# Patient Record
Sex: Male | Born: 1945 | Race: White | Marital: Married | Smoking: Never smoker
Health system: Southern US, Community
[De-identification: ages and names within clinical notes are randomized; demographics above are authoritative.]

## PROBLEM LIST (undated history)

## (undated) DIAGNOSIS — Z803 Family history of malignant neoplasm of breast: Secondary | ICD-10-CM

## (undated) DIAGNOSIS — H548 Legal blindness, as defined in USA: Secondary | ICD-10-CM

## (undated) DIAGNOSIS — Z800 Family history of malignant neoplasm of digestive organs: Secondary | ICD-10-CM

## (undated) HISTORY — DX: Family history of malignant neoplasm of breast: Z80.3

## (undated) HISTORY — DX: Family history of malignant neoplasm of digestive organs: Z80.0

## (undated) HISTORY — DX: Legal blindness, as defined in USA: H54.8

---

## 2019-10-08 ENCOUNTER — Ambulatory Visit: Payer: Self-pay

## 2019-10-27 ENCOUNTER — Ambulatory Visit: Payer: Self-pay

## 2021-02-18 ENCOUNTER — Other Ambulatory Visit: Payer: Self-pay | Admitting: Internal Medicine

## 2021-02-18 DIAGNOSIS — E7800 Pure hypercholesterolemia, unspecified: Secondary | ICD-10-CM

## 2021-03-12 ENCOUNTER — Ambulatory Visit
Admission: RE | Admit: 2021-03-12 | Discharge: 2021-03-12 | Disposition: A | Discharge status: Home / Self Care | Payer: No Typology Code available for payment source | Source: Ambulatory Visit | Attending: Internal Medicine | Admitting: Internal Medicine

## 2021-03-12 DIAGNOSIS — E7800 Pure hypercholesterolemia, unspecified: Secondary | ICD-10-CM

## 2021-06-25 DIAGNOSIS — E7800 Pure hypercholesterolemia, unspecified: Secondary | ICD-10-CM | POA: Diagnosis not present

## 2021-07-04 ENCOUNTER — Ambulatory Visit: Payer: No Typology Code available for payment source | Admitting: Cardiovascular Disease

## 2021-07-10 ENCOUNTER — Ambulatory Visit (INDEPENDENT_AMBULATORY_CARE_PROVIDER_SITE_OTHER): Payer: Medicare PPO | Admitting: Cardiovascular Disease

## 2021-07-10 ENCOUNTER — Encounter: Payer: Self-pay | Admitting: Cardiovascular Disease

## 2021-07-10 ENCOUNTER — Other Ambulatory Visit: Payer: Self-pay

## 2021-07-10 VITALS — BP 140/82 | HR 80 | Ht 73.0 in | Wt 246.4 lb

## 2021-07-10 DIAGNOSIS — R931 Abnormal findings on diagnostic imaging of heart and coronary circulation: Secondary | ICD-10-CM | POA: Diagnosis not present

## 2021-07-10 DIAGNOSIS — E7800 Pure hypercholesterolemia, unspecified: Secondary | ICD-10-CM | POA: Diagnosis not present

## 2021-07-10 DIAGNOSIS — I459 Conduction disorder, unspecified: Secondary | ICD-10-CM | POA: Diagnosis not present

## 2021-07-10 DIAGNOSIS — E669 Obesity, unspecified: Secondary | ICD-10-CM | POA: Diagnosis not present

## 2021-07-10 NOTE — Addendum Note (Signed)
Addended by: Thurmon Fair on: 07/10/2021 05:20 PM   Modules accepted: Level of Service

## 2021-07-10 NOTE — Patient Instructions (Signed)
Medication Instructions:  ?No changes ?*If you need a refill on your cardiac medications before your next appointment, please call your pharmacy* ? ? ?Lab Work: ?None ordered ?If you have labs (blood work) drawn today and your tests are completely normal, you will receive your results only by: ?MyChart Message (if you have MyChart) OR ?A paper copy in the mail ?If you have any lab test that is abnormal or we need to change your treatment, we will call you to review the results. ? ? ?Testing/Procedures: ?Your physician has requested that you have an exercise tolerance test. For further information please visit [email or web address]. Please also follow instruction sheet, as given. This will take place at [address], [address]. ?Do not drink or eat foods with caffeine for 24 hours before the test. (Chocolate, coffee, tea, or energy drinks) ?If you use an inhaler, bring it with you to the test. ?Do not smoke for 4 hours before the test. ?Wear comfortable shoes and clothing. ? ? ? ?Follow-Up: ?At [facility], you and your health needs are our priority.  As part of our continuing mission to provide you with exceptional heart care, we have created designated Provider Care Teams.  These Care Teams include your primary Cardiologist (physician) and Advanced Practice Providers (APPs -  Physician Assistants and Nurse Practitioners) who all work together to provide you with the care you need, when you need it. ? ?We recommend signing up for the patient portal called "MyChart".  Sign up information is provided on this After Visit Summary.  MyChart is used to connect with patients for Virtual Visits (Telemedicine).  Patients are able to view lab/test results, encounter notes, upcoming appointments, etc.  Non-urgent messages can be sent to your provider as well.   ?To learn more about what you can do with MyChart, go to [email or web address].   ? ?Your next appointment:   ?12 month(s) ? ?The format for your next  appointment:   ?In Person ? ?Provider:   ?Mihai Croitoru, MD ? ? ?

## 2021-07-10 NOTE — Progress Notes (Addendum)
Cardiology Office Note:    Date:  07/10/2021   ID:  Carlos Suarez, DOB [birth year], MRN [id number]  PCP:  Carlos Brunette, MD   [facility] Providers Cardiologist:  Carlos Fair, MD     Referring MD: Carlos Brunette, MD   Chief Complaint  Patient presents with   Elevated coronary calcium score  Carlos Suarez is a 75 y.o. male who is being seen today for the evaluation of abnormal coronary calcium score at the request of Carlos Brunette, MD.    History of Present Illness:    Carlos Suarez is a 75 y.o. male with a hx of hypercholesterolemia, otherwise excellent health, referred for an elevated coronary calcium score He does not have a known history of CAD or PAD, stroke or myocardial infarction.  He has been taking simvastatin monotherapy for hypercholesterolemia without side effects.  LDL cholesterol was 113 on labs performed in June.  In July, he underwent a coronary calcium score that was elevated at 527 (71st percentile) and also showed aortic atherosclerosis with normal caliber aorta.  He was started on rosuvastatin and ezetimibe.  I do not have his most recent lipid profile, but he tells me he thinks his LDL is now around 70.  The patient specifically denies any chest pain at rest exertion, dyspnea at rest or with exertion, orthopnea, paroxysmal nocturnal dyspnea, syncope, palpitations, focal neurological deficits, intermittent claudication, lower extremity edema, unexplained weight gain, cough, hemoptysis or wheezing.  He walks about 2-1/2 miles 5 to 6 days a week, without complaints of dyspnea or angina.  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  Nevertheless he enjoys target shooting.  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  Past Medical History:  Diagnosis Date   Legally blind in right eye, as defined in [place]     History reviewed. No pertinent surgical history.  Current Medications: Current Meds  Medication Sig   ezetimibe (ZETIA) 10 MG tablet Take 10 mg by mouth  daily.   rosuvastatin (CRESTOR) 20 MG tablet Take by mouth daily.     Allergies:   Fluothane [halothane] and Oxycodone   Social History   Socioeconomic History   Marital status: Married    Spouse name: Not on file   Number of children: Not on file   Years of education: Not on file   Highest education level: Not on file  Occupational History   Not on file  Tobacco Use   Smoking status: Never   Smokeless tobacco: Never  Substance and Sexual Activity   Alcohol use: Not on file   Drug use: Not on file   Sexual activity: Not on file  Other Topics Concern   Not on file  Social History Narrative   Not on file   Social Determinants of Health   Financial Resource Strain: Not on file  Food Insecurity: Not on file  Transportation Needs: Not on file  Physical Activity: Not on file  Stress: Not on file  Social Connections: Not on file     Family History: The patient's family history significantly negative for early onset CAD.  ROS:   Please see the history of present illness.     All other systems reviewed and are negative.  EKGs/Labs/Other Studies Reviewed:    The following studies were reviewed today: Coronary calcium score notes and labs from Dr. Renne Suarez  EKG:  EKG is ordered today.  The ekg ordered today demonstrates normal sinus rhythm (probable left atrial abnormality versus interatrial conduction delay with a  very broad P wave with a major negative deflection), nonspecific intraventricular conduction delay (QRS 118 ms), borderline QTC 454 ms.  Recent Labs: No results found for requested labs within last 8760 hours.  Recent Lipid Panel No results found for: CHOL, TRIG, HDL, CHOLHDL, VLDL, LDLCALC, LDLDIRECT  02/13/2021  Cholesterol 195, HDL 57, LDL 113 prior to most recent change in medications. Hemoglobin 14.1, normal liver function tests, potassium 5.5, creatinine 1.12  Risk Assessment/Calculations:           Physical Exam:    VS:  BP 140/82   Pulse 80    Ht 6\' 1"  (1.854 m)   Wt 246 lb 6.4 oz (111.8 kg)   SpO2 100%   BMI 32.51 kg/m     Wt Readings from Last 3 Encounters:  07/10/21 246 lb 6.4 oz (111.8 kg)     GEN: Appears fit, younger than stated age, well nourished, well developed in no acute distress HEENT: Opacification of right cornea with right eye blindness NECK: No JVD; No carotid bruits LYMPHATICS: No lymphadenopathy CARDIAC: RRR, no murmurs, rubs, gallops RESPIRATORY:  Clear to auscultation without rales, wheezing or rhonchi  ABDOMEN: Soft, non-tender, non-distended MUSCULOSKELETAL:  No edema; No deformity  SKIN: Warm and dry NEUROLOGIC:  Alert and oriented x 3 PSYCHIATRIC:  Normal affect   ASSESSMENT:    1. Elevated coronary artery calcium score   2. Hypercholesterolemia   3. Intraventricular conduction delay   4. Mild obesity    PLAN:    In order of problems listed above:  Abnormal coronary calcium score: Moderately elevated.  When taking this into account his 10-year risk of a major coronary heart event increases to about 20%.  Strongly agree with Dr. 13/10/22 decision to enhance his lipid-lowering therapy.  Target LDL less than 70.  We will schedule him for a plain treadmill stress test. Hyperlipidemia: Tolerating combination of rosuvastatin and ezetimibe.  Would benefit from weight loss. IVCD: Broad QRS at almost 120 ms but not really meeting criteria for either left or right bundle branch block.  No signs or symptoms that he has had high-grade AV block.  Advised to promptly report any episodes of otherwise unexplained dizziness or any syncope or extreme fatigue slow/irregular heartbeats.  No treatment needed.  Continue to monitor.      Shared Decision Making/Informed Consent The risks [chest pain, shortness of breath, cardiac arrhythmias, dizziness, blood pressure fluctuations, myocardial infarction, stroke/transient ischemic attack, and life-threatening complications (estimated to be 1 in 10,000)], benefits  (risk stratification, diagnosing coronary artery disease, treatment guidance) and alternatives of an exercise tolerance test were discussed in detail with Carlos Suarez and he agrees to proceed.    Medication Adjustments/Labs and Tests Ordered: Current medicines are reviewed at length with the patient today.  Concerns regarding medicines are outlined above.  Orders Placed This Encounter  Procedures   Cardiac Stress Test: Informed Consent Details: Physician/Practitioner Attestation; Transcribe to consent form and obtain patient signature   Exercise Tolerance Test   EKG 12-Lead   No orders of the defined types were placed in this encounter.   Patient Instructions  Medication Instructions:  No changes *If you need a refill on your cardiac medications before your next appointment, please call your pharmacy*   Lab Work: None ordered If you have labs (blood work) drawn today and your tests are completely normal, you will receive your results only by: MyChart Message (if you have MyChart) OR A paper copy in the mail If you have any lab  test that is abnormal or we need to change your treatment, we will call you to review the results.   Testing/Procedures: Your physician has requested that you have an exercise tolerance test. For further information please visit [email or web address] Please also follow instruction sheet, as given. This will take place at [address], [address]. Do not drink or eat foods with caffeine for 24 hours before the test. (Chocolate, coffee, tea, or energy drinks) If you use an inhaler, bring it with you to the test. Do not smoke for 4 hours before the test. Wear comfortable shoes and clothing.  Follow-Up: At [facility], you and your health needs are our priority.  As part of our continuing mission to provide you with exceptional heart care, we have created designated Provider Care Teams.  These Care Teams include your primary Cardiologist (physician)  and Advanced Practice Providers (APPs -  Physician Assistants and Nurse Practitioners) who all work together to provide you with the care you need, when you need it.  We recommend signing up for the patient portal called "MyChart".  Sign up information is provided on this After Visit Summary.  MyChart is used to connect with patients for Virtual Visits (Telemedicine).  Patients are able to view lab/test results, encounter notes, upcoming appointments, etc.  Non-urgent messages can be sent to your provider as well.   To learn more about what you can do with MyChart, go to [email or web address].    Your next appointment:   12 month(s)  The format for your next appointment:   In Person  Provider:   Thurmon Fair, MD   Signed, Carlos Fair, MD  07/10/2021 5:20 PM    [facility]

## 2021-07-11 ENCOUNTER — Telehealth (HOSPITAL_COMMUNITY): Payer: Self-pay | Admitting: *Deleted

## 2021-07-11 NOTE — Telephone Encounter (Signed)
Close encounter 

## 2021-07-15 ENCOUNTER — Encounter (HOSPITAL_COMMUNITY): Payer: Self-pay | Admitting: *Deleted

## 2021-07-15 ENCOUNTER — Other Ambulatory Visit: Payer: Self-pay

## 2021-07-15 ENCOUNTER — Ambulatory Visit (HOSPITAL_COMMUNITY)
Admission: RE | Admit: 2021-07-15 | Discharge: 2021-07-15 | Disposition: A | Discharge status: Home / Self Care | Payer: Medicare PPO | Source: Ambulatory Visit | Attending: Cardiovascular Disease | Admitting: Cardiovascular Disease

## 2021-07-15 ENCOUNTER — Encounter (HOSPITAL_COMMUNITY): Payer: Self-pay

## 2021-07-15 DIAGNOSIS — R931 Abnormal findings on diagnostic imaging of heart and coronary circulation: Secondary | ICD-10-CM

## 2021-07-15 NOTE — Progress Notes (Unsigned)
Patient arrived for his ETT but was not able to complete the study due to the ABNORMAL EKG that was taken prior to starting the actual test. Abnormal rhythm strips and EKG were reviewed by Dr. Allyson Sabal. He stated that the study would be read inconclusive due to the ABNORMAL EKG changes.

## 2021-07-17 ENCOUNTER — Other Ambulatory Visit: Payer: Self-pay | Admitting: *Deleted

## 2021-07-17 DIAGNOSIS — E7800 Pure hypercholesterolemia, unspecified: Secondary | ICD-10-CM

## 2021-07-17 DIAGNOSIS — R931 Abnormal findings on diagnostic imaging of heart and coronary circulation: Secondary | ICD-10-CM

## 2021-07-22 ENCOUNTER — Encounter (HOSPITAL_COMMUNITY): Payer: Self-pay | Admitting: Cardiovascular Disease

## 2021-07-30 ENCOUNTER — Telehealth (HOSPITAL_COMMUNITY): Payer: Self-pay | Admitting: *Deleted

## 2021-07-30 NOTE — Telephone Encounter (Signed)
Patient given detailed instructions per Myocardial Perfusion Study Information Sheet for the test on 08/06/21  Patient notified to arrive 15 minutes early and that it is imperative to arrive on time for appointment to keep from having the test rescheduled.  If you need to cancel or reschedule your appointment, please call the office within 24 hours of your appointment. . Patient verbalized understanding. Carlos Suarez   

## 2021-08-06 ENCOUNTER — Ambulatory Visit (HOSPITAL_COMMUNITY): Payer: Medicare PPO | Attending: Cardiovascular Disease

## 2021-08-06 ENCOUNTER — Other Ambulatory Visit: Payer: Self-pay

## 2021-08-06 DIAGNOSIS — E7800 Pure hypercholesterolemia, unspecified: Secondary | ICD-10-CM | POA: Insufficient documentation

## 2021-08-06 DIAGNOSIS — R931 Abnormal findings on diagnostic imaging of heart and coronary circulation: Secondary | ICD-10-CM | POA: Insufficient documentation

## 2021-08-06 DIAGNOSIS — R0609 Other forms of dyspnea: Secondary | ICD-10-CM | POA: Insufficient documentation

## 2021-08-06 DIAGNOSIS — R9431 Abnormal electrocardiogram [ECG] [EKG]: Secondary | ICD-10-CM | POA: Insufficient documentation

## 2021-08-06 DIAGNOSIS — Z136 Encounter for screening for cardiovascular disorders: Secondary | ICD-10-CM | POA: Insufficient documentation

## 2021-08-06 LAB — MYOCARDIAL PERFUSION IMAGING
Base ST Depression (mm): 1 mm
LV dias vol: 179 mL (ref 62–150)
LV sys vol: 107 mL
Nuc Stress EF: 40 %
Peak HR: 97 {beats}/min
Rest HR: 77 {beats}/min
Rest Nuclear Isotope Dose: 10.9 mCi
SDS: 5
SRS: 1
SSS: 6
ST Depression (mm): 0 mm
Stress Nuclear Isotope Dose: 32.5 mCi
TID: 0.85

## 2021-08-06 MED ORDER — TECHNETIUM TC 99M TETROFOSMIN IV KIT
32.5000 | PACK | Freq: Once | INTRAVENOUS | Status: AC | PRN
  Administered 2021-08-06: 32.5 via INTRAVENOUS
  Filled 2021-08-06: qty 33

## 2021-08-06 MED ORDER — TECHNETIUM TC 99M TETROFOSMIN IV KIT
10.9000 | PACK | Freq: Once | INTRAVENOUS | Status: AC | PRN
  Administered 2021-08-06: 10.9 via INTRAVENOUS
  Filled 2021-08-06: qty 11

## 2021-08-06 MED ORDER — REGADENOSON 0.4 MG/5ML IV SOLN
0.4000 mg | Freq: Once | INTRAVENOUS | Status: AC
  Administered 2021-08-06: 0.4 mg via INTRAVENOUS

## 2021-08-07 ENCOUNTER — Other Ambulatory Visit: Payer: Self-pay | Admitting: *Deleted

## 2021-08-07 DIAGNOSIS — R931 Abnormal findings on diagnostic imaging of heart and coronary circulation: Secondary | ICD-10-CM

## 2021-08-22 ENCOUNTER — Ambulatory Visit (HOSPITAL_COMMUNITY): Payer: Medicare PPO | Attending: Cardiology

## 2021-08-22 ENCOUNTER — Other Ambulatory Visit: Payer: Self-pay

## 2021-08-22 DIAGNOSIS — R931 Abnormal findings on diagnostic imaging of heart and coronary circulation: Secondary | ICD-10-CM | POA: Insufficient documentation

## 2021-08-22 DIAGNOSIS — E7800 Pure hypercholesterolemia, unspecified: Secondary | ICD-10-CM | POA: Diagnosis not present

## 2021-08-22 DIAGNOSIS — I351 Nonrheumatic aortic (valve) insufficiency: Secondary | ICD-10-CM | POA: Diagnosis not present

## 2021-08-23 LAB — ECHOCARDIOGRAM COMPLETE
AR max vel: 3.36 cm2
AV Area VTI: 3.32 cm2
AV Area mean vel: 3.06 cm2
AV Mean grad: 4 mmHg
AV Peak grad: 5.8 mmHg
Ao pk vel: 1.21 m/s
Area-P 1/2: 2.74 cm2
P 1/2 time: 465 msec
S' Lateral: 4.5 cm

## 2021-08-25 DIAGNOSIS — E785 Hyperlipidemia, unspecified: Secondary | ICD-10-CM | POA: Diagnosis not present

## 2021-08-25 DIAGNOSIS — Z7982 Long term (current) use of aspirin: Secondary | ICD-10-CM | POA: Diagnosis not present

## 2021-08-25 DIAGNOSIS — Z6833 Body mass index (BMI) 33.0-33.9, adult: Secondary | ICD-10-CM | POA: Diagnosis not present

## 2021-08-25 DIAGNOSIS — E669 Obesity, unspecified: Secondary | ICD-10-CM | POA: Diagnosis not present

## 2021-08-25 DIAGNOSIS — R030 Elevated blood-pressure reading, without diagnosis of hypertension: Secondary | ICD-10-CM | POA: Diagnosis not present

## 2021-08-25 DIAGNOSIS — I951 Orthostatic hypotension: Secondary | ICD-10-CM | POA: Diagnosis not present

## 2021-08-26 ENCOUNTER — Telehealth: Payer: Self-pay | Admitting: *Deleted

## 2021-08-26 ENCOUNTER — Encounter: Payer: Self-pay | Admitting: *Deleted

## 2021-08-26 DIAGNOSIS — R931 Abnormal findings on diagnostic imaging of heart and coronary circulation: Secondary | ICD-10-CM

## 2021-08-26 DIAGNOSIS — Z01818 Encounter for other preprocedural examination: Secondary | ICD-10-CM

## 2021-08-26 NOTE — Telephone Encounter (Signed)
-----   Message from Thurmon Fair, MD sent at 08/25/2021  9:28 AM EST ----- Echocardiogram does show reduction in heart pumping function with ejection fraction of 45-50% (normal 55-60%).  The nuclear stress test had estimated the ejection fraction to be 40%. The interpreting cardiologist thought that there were some regional abnormalities (this would support a diagnosis of coronary blockages), but honestly, on my review I think it is a global reduction in heart pumping function. Nevertheless, the logical next step is heart catheterization and coronary angiography, the gold standard for evaluating coronary blockages.  This is not an emergency since he is not having any symptoms. Will try to call him directly later today

## 2021-08-26 NOTE — Telephone Encounter (Signed)
Left a message for the patient to call back.  

## 2021-08-26 NOTE — Telephone Encounter (Signed)
Patient made aware of results and verbalized understanding. He stated that he is currently asymptomatic. He has been advised to call if anything changes.   Cardiac cath scheduled for 09/10/21 with Dr. Kirke Corin. Instructions have been sent via MyChart as well The patient would like to talk to Dr. Royann Shivers when he returns to discuss the cath before he proceeds with it.    You are scheduled for a Cardiac Catheterization on Wednesday, January 11 with Dr. Lorine Bears.  1. Please arrive at the [facility] (Main Entrance A) at [facility]: [address] [place], [place] [address] at 12:00 PM (This time is two hours before your procedure to ensure your preparation). Free valet parking service is available.   Special note: Every effort is made to have your procedure done on time. Please understand that emergencies sometimes delay scheduled procedures.  2. Diet: Do not eat solid foods after midnight.  The patient may have clear liquids until 5am upon the day of the procedure.  3. Labs:  Your provider would like for you to return  by 09/03/21 to have the following labs drawn: BMET and CBC. You do not need an appointment for the lab. Once in our office lobby there is a podium where you can sign in and ring the doorbell to alert [place] that you are here. The lab is open from 8:00 am to 4:30 pm; closed for lunch from 12:45pm-1:45pm.   4. Medication instructions in preparation for your procedure: Nothing to hold  On the morning of your procedure, take your Aspirin and any morning medicines NOT listed above.  You may use sips of water.  5. Plan for one night stay--bring personal belongings. 6. Bring a current list of your medications and current insurance cards. 7. You MUST have a responsible person to drive you home. 8. Someone MUST be with you the first 24 hours after you arrive home or your discharge will be delayed. 9. Please wear clothes that are easy to get on and off and wear slip-on  shoes.  Thank you for allowing [place] to care for you!   -- [facility]

## 2021-09-02 DIAGNOSIS — R931 Abnormal findings on diagnostic imaging of heart and coronary circulation: Secondary | ICD-10-CM | POA: Diagnosis not present

## 2021-09-02 DIAGNOSIS — Z01818 Encounter for other preprocedural examination: Secondary | ICD-10-CM | POA: Diagnosis not present

## 2021-09-03 ENCOUNTER — Telehealth: Payer: Self-pay | Admitting: Cardiovascular Disease

## 2021-09-03 LAB — BASIC METABOLIC PANEL
BUN/Creatinine Ratio: 15 (ref 10–24)
BUN: 17 mg/dL (ref 8–27)
CO2: 20 mmol/L (ref 20–29)
Calcium: 9.6 mg/dL (ref 8.6–10.2)
Chloride: 103 mmol/L (ref 96–106)
Creatinine, Ser: 1.13 mg/dL (ref 0.76–1.27)
Glucose: 81 mg/dL (ref 70–99)
Potassium: 5.1 mmol/L (ref 3.5–5.2)
Sodium: 142 mmol/L (ref 134–144)
eGFR: 69 mL/min/{1.73_m2} (ref >59)

## 2021-09-03 LAB — CBC
Hematocrit: 42.7 % (ref 37.5–51.0)
Hemoglobin: 13.8 g/dL (ref 13.0–17.7)
MCH: 30 pg (ref 26.6–33.0)
MCHC: 32.3 g/dL (ref 31.5–35.7)
MCV: 93 fL (ref 79–97)
Platelets: 161 10*3/uL (ref 150–450)
RBC: 4.6 x10E6/uL (ref 4.14–5.80)
RDW: 12.2 % (ref 11.6–15.4)
WBC: 8.2 10*3/uL (ref 3.4–10.8)

## 2021-09-03 NOTE — Telephone Encounter (Addendum)
Spoke with patient. He stated he got blood work yesterday, and later in the day, when he removed his bandage, "blood trickled down my arm and onto the floor." The bleeding stopped after 3-5 minutes of applied pressure. He also asked about lab results. I told him they were normal. The patient wants to know if he should still be taking the ASA 81 mg because he is having a cardiac cath next week. Please advise.

## 2021-09-03 NOTE — Telephone Encounter (Signed)
Patient has an appointment on 1/9 with Dr. Royann Shivers. This will be discussed then. Patient is aware.

## 2021-09-03 NOTE — Telephone Encounter (Signed)
Pt c/o medication issue:  1. Name of Medication: aspirin EC 81 MG tablet  2. How are you currently taking this medication (dosage and times per day)? 1 tablet daily  3. Are you having a reaction (difficulty breathing--STAT)? no  4. What is your medication issue? Patient states he had lab work done yesterday, but when he removed the bandage there was a squirt of blood. He says he has a cath scheduled in about a week and would like to make sure it is still okay for him to hold the baby aspirin.

## 2021-09-08 ENCOUNTER — Ambulatory Visit: Payer: Medicare PPO | Admitting: Cardiovascular Disease

## 2021-09-08 ENCOUNTER — Encounter: Payer: Self-pay | Admitting: Cardiovascular Disease

## 2021-09-08 ENCOUNTER — Other Ambulatory Visit: Payer: Self-pay

## 2021-09-08 VITALS — BP 134/76 | HR 83 | Ht 73.0 in | Wt 245.0 lb

## 2021-09-08 DIAGNOSIS — R931 Abnormal findings on diagnostic imaging of heart and coronary circulation: Secondary | ICD-10-CM | POA: Diagnosis not present

## 2021-09-08 NOTE — Progress Notes (Signed)
°Cardiology Office Note:   ° °Date:  09/08/2021  ° °ID:  Carlos Suarez, DOB 04/19/1946, MRN [id number] ° °PCP:  Pharr, Walter, MD °  °[facility] Providers °Cardiologist:  Itzae Miralles, MD    ° °Referring MD: Pharr, Walter, MD  ° °Chief Complaint  °Patient presents with  ° Cardiomyopathy  ° ° °History of Present Illness:   ° °Thedore Suarez is a 76 y.o. 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 ° °He has never had angina pectoris, either at rest or with activity.  Throughout most of the year he has a daily routine where he walks 2 miles.  He usually outpaces his wife and does extra laps.  He denies exertional dyspnea.  Over the summer he did notice increased fatigue, but blamed this on the heat and on "getting old".  His baseline ECG shows sinus rhythm with left anterior fascicular block, without any Q waves or repolarization abnormalities.  He takes a combination of rosuvastatin and ezetimibe for his hypercholesterolemia.  He denies palpitations, syncope, orthopnea, PND, lower extremity edema, claudication, focal neurological complaints other than right eye blindness. ° °He was initially scheduled for a plain ECG treadmill stress test, but when he presented he had baseline ECG changes and it was felt by the DOD that this would interfere with his ECG tracing evaluation.  He was therefore switched to a pharmacological nuclear scan.  This showed a normal pattern of myocardial perfusion, but surprisingly showed a decreased LVEF of only 40%.  An echocardiogram was performed to confirm this and showed an LVEF of 45-50%.  The ventricle was also dilated with an end-diastolic diameter of 6.1 cm.  The left atrium was also markedly dilated at 4.9 cm.  The echo was felt to show hypokinesis of the mid-apical  inferoseptum and inferior wall, although on my evaluation, I think that the left ventricular dysfunction is global.  It was noted that he had frequent ventricular ectopy throughout the study. ° °Past Medical History:  °Diagnosis Date  ° Legally blind in right eye, as defined in [place]   ° ° °History reviewed. No pertinent surgical history. ° °Current Medications: °Current Meds  °Medication Sig  ° aspirin EC 81 MG tablet Take 81 mg by mouth daily. Swallow whole.  ° ezetimibe (ZETIA) 10 MG tablet Take 10 mg by mouth daily.  ° rosuvastatin (CRESTOR) 20 MG tablet Take by mouth daily.  °  ° °Allergies:   Fluothane [halothane] and Oxycodone  ° °Social History  ° °Socioeconomic History  ° Marital status: Married  °  Spouse name: Not on file  ° Number of children: Not on file  ° Years of education: Not on file  ° Highest education level: Not on file  °Occupational History  ° Not on file  °Tobacco Use  ° Smoking status: Never  ° Smokeless tobacco: Never  °Substance and Sexual Activity  ° Alcohol use: Not on file  ° Drug use: Not on file  ° Sexual activity: Not on file  °Other Topics Concern  ° Not on file  °Social History Narrative  ° Not on file  ° °Social Determinants of Health  ° °Financial Resource Strain: Not on file  °Food Insecurity: Not on file  °Transportation Needs: Not on file  °Physical Activity: Not on file  °Stress: Not on file  °Social   Connections: Not on file  °  ° °Family History: °The patient's only history is significant for the absence of early onset CAD or CHF, known cardiomyopathy or unexplained premature death ° °ROS:   °Please see the history of present illness.    ° All other systems reviewed and are negative. ° °EKGs/Labs/Other Studies Reviewed:   ° °The following studies were reviewed today: °08/06/2021 pharmacological nuclear perfusion study °  Findings are consistent with no prior ischemia and no prior myocardial infarction. The study is intermediate risk. °  No ST deviation was noted. °  LV  perfusion is normal. °  Left ventricular function is abnormal. Global function is moderately reduced. Nuclear stress EF: 40 %. The left ventricular ejection fraction is moderately decreased (30-44%). End diastolic cavity size is mildly enlarged. °  Prior study not available for comparison. °  °Intermediate risk study with normal myocardial perfusion, but with a dilated left ventricle and moderately reduced global systolic function. °Findings suggest nonischemic dilated cardiomyopathy, but gated images could be causing artifactual underestimation of left ventricular function due to frequent PVCs. °Recommend correlation with echocardiogram. ° °Echocardiogram 08/22/2021 ° ° 1. Left ventricular ejection fraction, by estimation, is 45 to 50%. The  °left ventricle has mildly decreased function. The left ventricle  °demonstrates regional wall motion abnormalities (see scoring  °diagram/findings for description). The left ventricular  ° internal cavity size was moderately dilated. Left ventricular diastolic  °parameters are indeterminate. There is hypokinesis of the left  °ventricular, mid-apical inferoseptal wall and inferior wall.  ° 2. Right ventricular systolic function is normal. The right ventricular  °size is normal. There is normal pulmonary artery systolic pressure.  ° 3. The mitral valve is normal in structure. Trivial mitral valve  °regurgitation. No evidence of mitral stenosis.  ° 4. The aortic valve is tricuspid. Aortic valve regurgitation is mild. No  °aortic stenosis is present.  ° 5. The inferior vena cava is normal in size with greater than 50%  °respiratory variability, suggesting right atrial pressure of 3 mmHg.  ° °Comparison(s): No prior Echocardiogram.  ° °Conclusion(s)/Recommendation(s): Frequent ectopy throughout study, reduced  °sensitivity for focal wall motion abnormalities, especially in anterior  °and anterolateral walls.  ° °EKG:  EKG is ordered today.  The ekg ordered today demonstrates normal  sinus rhythm, left anterior fascicular block, no change ° °Recent Labs: °09/02/2021: BUN 17; Creatinine, Ser 1.13; Hemoglobin 13.8; Platelets 161; Potassium 5.1; Sodium 142  °Recent Lipid Panel °No results found for: CHOL, TRIG, HDL, CHOLHDL, VLDL, LDLCALC, LDLDIRECT °02/13/2021 °02/13/2021 °  °Cholesterol 195, HDL 57, LDL 113 prior to most recent change in medications. °Hemoglobin 14.1, normal liver function tests, potassium 5.5, creatinine 1.12 ° °Risk Assessment/Calculations:   °  ° °    ° °Physical Exam:   ° °VS:  BP 134/76 (BP Location: Left Arm, Patient Position: Sitting, Cuff Size: Large)    Pulse 83    Ht 6' 1" (1.854 m)    Wt 245 lb (111.1 kg)    SpO2 96%    BMI 32.32 kg/m²    ° °Wt Readings from Last 3 Encounters:  °09/08/21 245 lb (111.1 kg)  °08/06/21 246 lb (111.6 kg)  °07/10/21 246 lb 6.4 oz (111.8 kg)  °  ° °GEN: Obese, well nourished, well developed in no acute distress °HEENT: Normal °NECK: No JVD; No carotid bruits °LYMPHATICS: No lymphadenopathy °CARDIAC: Occasional ectopy on a background of RRR, no murmurs, rubs, gallops °RESPIRATORY:  Clear to auscultation without rales, wheezing or   rhonchi  °ABDOMEN: Soft, non-tender, non-distended °MUSCULOSKELETAL:  No edema; No deformity  °SKIN: Warm and dry °NEUROLOGIC:  Alert and oriented x 3 °PSYCHIATRIC:  Normal affect  ° °ASSESSMENT:   ° °1. Elevated coronary artery calcium score   ° °PLAN:   ° °In order of problems listed above: ° °Cardiomyopathy: Both the echocardiogram and the nuclear perfusion study show a mildly depressed LVEF.  He does not have any symptoms of congestive heart failure (NYHA functional class I) and he is euvolemic clinically.  In view of his age, gender and elevated coronary calcium score, the first suspicion is that he may have more extensive CAD than we realize (potentially dominant left main coronary artery stenosis).  Recommend cardiac catheterization, coronary angiography, revascularization if appropriate. This procedure has been  fully reviewed with the patient and written informed consent has been obtained.  Having said that, he has never had anything that could be construed to be angina pectoris, does not have any ischemic changes on ECG, and the coronary calcium score, although abnormal, is not that impressive.  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 Would like a R and L heart cath to confirm what appear to be well compensated hemodynamics. °Elevated coronary calcium score: Target LDL cholesterol less than 70.  Ezetimibe was added to his statin a few months ago, repeat lipid panel planned in Dr. Pharr's office. 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  Unlikely to benefit from CRT. °   ° °Shared Decision Making/Informed Consent °The risks [stroke (1 in 1000), death (1 in 1000), kidney failure [usually temporary] (1 in 500), bleeding (1 in 200), allergic reaction [possibly serious] (1 in 200)], benefits (diagnostic support and management of coronary artery disease) and alternatives of a cardiac catheterization were discussed in detail with Mr. Allcock and he is willing to proceed.  ° ° °Medication Adjustments/Labs and Tests Ordered: °Current medicines are reviewed at length with the patient today.  Concerns regarding medicines are outlined above.  °Orders Placed This Encounter  °Procedures  ° EKG 12-Lead  ° °No orders of the defined types were placed in this encounter. ° ° °Patient Instructions  °Medication Instructions:  °No changes °*If you need a refill on your cardiac medications before your next appointment, please call your pharmacy* ° ° °Lab Work: °None ordered °If you have labs (blood work) drawn today and your tests are completely normal, you will receive your results only by: °MyChart Message (if you have MyChart) OR °A paper copy in the mail °If you have any lab test that is abnormal or we need to change your treatment, we will call you to  review the results. ° ° °Testing/Procedures: °None ordered ° ° °Follow-Up: °At [facility], you and your health needs are our priority.  As part of our continuing mission to provide you with exceptional heart care, we have created designated Provider Care Teams.  These Care Teams include your primary Cardiologist (physician) and Advanced Practice Providers (APPs -  Physician Assistants and Nurse Practitioners) who all work together to provide you with the care you need, when you need it. ° °We recommend signing up for the patient portal called "MyChart".  Sign up information is provided on this After Visit Summary.  MyChart is used to connect with patients for Virtual Visits (Telemedicine).  Patients are able to view lab/test results, encounter notes, upcoming appointments, etc.  Non-urgent messages can be sent to your provider as well.   °  To learn more about what you can do with MyChart, go to [email or web address].   ° °Your next appointment:   °2 week(s)  ° °The format for your next appointment:   °In Person ° °Provider:   °Kaydan Wilhoite, MD  °If MD is not listed, click here to update    :1}  ° ° °  ° °Signed, °Dhiya Smits, MD  °09/08/2021 2:16 PM    °[facility] ° °

## 2021-09-08 NOTE — H&P (View-Only) (Signed)
Cardiology Office Note:    Date:  09/08/2021   ID:  Carlos Suarez, DOB 11-Dec-1945, MRN [id number]  PCP:  Deland Pretty, MD   [facility] Providers Cardiologist:  Sanda Klein, MD     Referring MD: Deland Pretty, MD   Chief Complaint  Patient presents with   Cardiomyopathy    History of Present Illness:    Carlos Suarez is a 76 y.o. 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  He has never had angina pectoris, either at rest or with activity.  Throughout most of the year he has a daily routine where he walks 2 miles.  He usually outpaces his wife and does extra laps.  He denies exertional dyspnea.  Over the summer he did notice increased fatigue, but blamed this on the heat and on "getting old".  His baseline ECG shows sinus rhythm with left anterior fascicular block, without any Q waves or repolarization abnormalities.  He takes a combination of rosuvastatin and ezetimibe for his hypercholesterolemia.  He denies palpitations, syncope, orthopnea, PND, lower extremity edema, claudication, focal neurological complaints other than right eye blindness.  He was initially scheduled for a plain ECG treadmill stress test, but when he presented he had baseline ECG changes and it was felt by the DOD that this would interfere with his ECG tracing evaluation.  He was therefore switched to a pharmacological nuclear scan.  This showed a normal pattern of myocardial perfusion, but surprisingly showed a decreased LVEF of only 40%.  An echocardiogram was performed to confirm this and showed an LVEF of 45-50%.  The ventricle was also dilated with an end-diastolic diameter of 6.1 cm.  The left atrium was also markedly dilated at 4.9 cm.  The echo was felt to show hypokinesis of the mid-apical  inferoseptum and inferior wall, although on my evaluation, I think that the left ventricular dysfunction is global.  It was noted that he had frequent ventricular ectopy throughout the study.  Past Medical History:  Diagnosis Date   Legally blind in right eye, as defined in [place]     History reviewed. No pertinent surgical history.  Current Medications: Current Meds  Medication Sig   aspirin EC 81 MG tablet Take 81 mg by mouth daily. Swallow whole.   ezetimibe (ZETIA) 10 MG tablet Take 10 mg by mouth daily.   rosuvastatin (CRESTOR) 20 MG tablet Take by mouth daily.     Allergies:   Fluothane [halothane] and Oxycodone   Social History   Socioeconomic History   Marital status: Married    Spouse name: Not on file   Number of children: Not on file   Years of education: Not on file   Highest education level: Not on file  Occupational History   Not on file  Tobacco Use   Smoking status: Never   Smokeless tobacco: Never  Substance and Sexual Activity   Alcohol use: Not on file   Drug use: Not on file   Sexual activity: Not on file  Other Topics Concern   Not on file  Social History Narrative   Not on file   Social Determinants of Health   Financial Resource Strain: Not on file  Food Insecurity: Not on file  Transportation Needs: Not on file  Physical Activity: Not on file  Stress: Not on file  Social  Connections: Not on file     Family History: The patient's only history is significant for the absence of early onset CAD or CHF, known cardiomyopathy or unexplained premature death  ROS:   Please see the history of present illness.     All other systems reviewed and are negative.  EKGs/Labs/Other Studies Reviewed:    The following studies were reviewed today: 08/06/2021 pharmacological nuclear perfusion study   Findings are consistent with no prior ischemia and no prior myocardial infarction. The study is intermediate risk.   No ST deviation was noted.   LV  perfusion is normal.   Left ventricular function is abnormal. Global function is moderately reduced. Nuclear stress EF: 40 %. The left ventricular ejection fraction is moderately decreased (30-44%). End diastolic cavity size is mildly enlarged.   Prior study not available for comparison.   Intermediate risk study with normal myocardial perfusion, but with a dilated left ventricle and moderately reduced global systolic function. Findings suggest nonischemic dilated cardiomyopathy, but gated images could be causing artifactual underestimation of left ventricular function due to frequent PVCs. Recommend correlation with echocardiogram.  Echocardiogram 08/22/2021   1. Left ventricular ejection fraction, by estimation, is 45 to 50%. The  left ventricle has mildly decreased function. The left ventricle  demonstrates regional wall motion abnormalities (see scoring  diagram/findings for description). The left ventricular   internal cavity size was moderately dilated. Left ventricular diastolic  parameters are indeterminate. There is hypokinesis of the left  ventricular, mid-apical inferoseptal wall and inferior wall.   2. Right ventricular systolic function is normal. The right ventricular  size is normal. There is normal pulmonary artery systolic pressure.   3. The mitral valve is normal in structure. Trivial mitral valve  regurgitation. No evidence of mitral stenosis.   4. The aortic valve is tricuspid. Aortic valve regurgitation is mild. No  aortic stenosis is present.   5. The inferior vena cava is normal in size with greater than 50%  respiratory variability, suggesting right atrial pressure of 3 mmHg.   Comparison(s): No prior Echocardiogram.   Conclusion(s)/Recommendation(s): Frequent ectopy throughout study, reduced  sensitivity for focal wall motion abnormalities, especially in anterior  and anterolateral walls.   EKG:  EKG is ordered today.  The ekg ordered today demonstrates normal  sinus rhythm, left anterior fascicular block, no change  Recent Labs: 09/02/2021: BUN 17; Creatinine, Ser 1.13; Hemoglobin 13.8; Platelets 161; Potassium 5.1; Sodium 142  Recent Lipid Panel No results found for: CHOL, TRIG, HDL, CHOLHDL, VLDL, LDLCALC, LDLDIRECT 02/13/2021 02/13/2021   Cholesterol 195, HDL 57, LDL 113 prior to most recent change in medications. Hemoglobin 14.1, normal liver function tests, potassium 5.5, creatinine 1.12  Risk Assessment/Calculations:           Physical Exam:    VS:  BP 134/76 (BP Location: Left Arm, Patient Position: Sitting, Cuff Size: Large)    Pulse 83    Ht 6\' 1"  (1.854 m)    Wt 245 lb (111.1 kg)    SpO2 96%    BMI 32.32 kg/m     Wt Readings from Last 3 Encounters:  09/08/21 245 lb (111.1 kg)  08/06/21 246 lb (111.6 kg)  07/10/21 246 lb 6.4 oz (111.8 kg)     GEN: Obese, well nourished, well developed in no acute distress HEENT: Normal NECK: No JVD; No carotid bruits LYMPHATICS: No lymphadenopathy CARDIAC: Occasional ectopy on a background of RRR, no murmurs, rubs, gallops RESPIRATORY:  Clear to auscultation without rales, wheezing or  rhonchi  ABDOMEN: Soft, non-tender, non-distended MUSCULOSKELETAL:  No edema; No deformity  SKIN: Warm and dry NEUROLOGIC:  Alert and oriented x 3 PSYCHIATRIC:  Normal affect   ASSESSMENT:    1. Elevated coronary artery calcium score    PLAN:    In order of problems listed above:  Cardiomyopathy: Both the echocardiogram and the nuclear perfusion study show a mildly depressed LVEF.  He does not have any symptoms of congestive heart failure (NYHA functional class I) and he is euvolemic clinically.  In view of his age, gender and elevated coronary calcium score, the first suspicion is that he may have more extensive CAD than we realize (potentially dominant left main coronary artery stenosis).  Recommend cardiac catheterization, coronary angiography, revascularization if appropriate. This procedure has been  fully reviewed with the patient and written informed consent has been obtained.  Having said that, he has never had anything that could be construed to be angina pectoris, does not have any ischemic changes on ECG, and the coronary calcium score, although abnormal, is not that impressive.  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 Would like a R and L heart cath to confirm what appear to be well compensated hemodynamics. Elevated coronary calcium score: Target LDL cholesterol less than 70.  Ezetimibe was added to his statin a few months ago, repeat lipid panel planned in Dr. Pennie Banter office. 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  Unlikely to benefit from CRT.     Shared Decision Making/Informed Consent The risks [stroke (1 in 1000), death (1 in 1000), kidney failure [usually temporary] (1 in 500), bleeding (1 in 200), allergic reaction [possibly serious] (1 in 200)], benefits (diagnostic support and management of coronary artery disease) and alternatives of a cardiac catheterization were discussed in detail with Mr. Garvie and he is willing to proceed.    Medication Adjustments/Labs and Tests Ordered: Current medicines are reviewed at length with the patient today.  Concerns regarding medicines are outlined above.  Orders Placed This Encounter  Procedures   EKG 12-Lead   No orders of the defined types were placed in this encounter.   Patient Instructions  Medication Instructions:  No changes *If you need a refill on your cardiac medications before your next appointment, please call your pharmacy*   Lab Work: None ordered If you have labs (blood work) drawn today and your tests are completely normal, you will receive your results only by: [place] (if you have MyChart) OR A paper copy in the mail If you have any lab test that is abnormal or we need to change your treatment, we will call you to  review the results.   Testing/Procedures: None ordered   Follow-Up: At [facility], you and your health needs are our priority.  As part of our continuing mission to provide you with exceptional heart care, we have created designated Provider Care Teams.  These Care Teams include your primary Cardiologist (physician) and Advanced Practice Providers (APPs -  Physician Assistants and Nurse Practitioners) who all work together to provide you with the care you need, when you need it.  We recommend signing up for the patient portal called "MyChart".  Sign up information is provided on this After Visit Summary.  MyChart is used to connect with patients for Virtual Visits (Telemedicine).  Patients are able to view lab/test results, encounter notes, upcoming appointments, etc.  Non-urgent messages can be sent to your provider as well.  To learn more about what you can do with MyChart, go to [email or web address].    Your next appointment:   2 week(s)   The format for your next appointment:   In Person  Provider:   Sanda Klein, MD  If MD is not listed, click here to update    :1}       Signed, Sanda Klein, MD  09/08/2021 2:16 PM    [place]

## 2021-09-08 NOTE — Patient Instructions (Signed)
Medication Instructions:  No changes *If you need a refill on your cardiac medications before your next appointment, please call your pharmacy*   Lab Work: None ordered If you have labs (blood work) drawn today and your tests are completely normal, you will receive your results only by: MyChart Message (if you have MyChart) OR A paper copy in the mail If you have any lab test that is abnormal or we need to change your treatment, we will call you to review the results.   Testing/Procedures: None ordered   Follow-Up: At [facility], you and your health needs are our priority.  As part of our continuing mission to provide you with exceptional heart care, we have created designated Provider Care Teams.  These Care Teams include your primary Cardiologist (physician) and Advanced Practice Providers (APPs -  Physician Assistants and Nurse Practitioners) who all work together to provide you with the care you need, when you need it.  We recommend signing up for the patient portal called "MyChart".  Sign up information is provided on this After Visit Summary.  MyChart is used to connect with patients for Virtual Visits (Telemedicine).  Patients are able to view lab/test results, encounter notes, upcoming appointments, etc.  Non-urgent messages can be sent to your provider as well.   To learn more about what you can do with MyChart, go to [email or web address].    Your next appointment:   2 week(s)   The format for your next appointment:   In Person  Provider:   Thurmon Fair, MD  If MD is not listed, click here to update    :1}

## 2021-09-09 ENCOUNTER — Telehealth: Payer: Self-pay | Admitting: Cardiovascular Disease

## 2021-09-09 ENCOUNTER — Telehealth: Payer: Self-pay | Admitting: *Deleted

## 2021-09-09 NOTE — Telephone Encounter (Signed)
A user error has taken place: encounter opened in error, closed for administrative reasons.

## 2021-09-09 NOTE — Telephone Encounter (Signed)
Pt still doesn't have insurance authorization on file for procedure tomorrow... please advise

## 2021-09-09 NOTE — Telephone Encounter (Signed)
Spoke with the precert department. Message for precert had already been sent previously. Cath authorization is pending.

## 2021-09-09 NOTE — Telephone Encounter (Signed)
Message sent to Pre Cert pool to pre cert cath scheduled tomorrow 09/10/21.

## 2021-09-09 NOTE — Telephone Encounter (Signed)
Cardiac catheterization scheduled at [facility] for: Wednesday September 10, 2021 2 PM [facility] Main Entrance A [facility]) at: [place] solid food after midnight prior to cath, clear liquids until 5 AM day of procedure.  Medication instructions for procedure: -Usual morning medications can be taken pre-cath with sips of water including aspirin 81 mg.    Confirmed patient has responsible adult to drive home post procedure and be with patient first 24 hours after arriving home.  [facility] does allow one visitor to accompany you and wait in the hospital waiting room while you are there for your procedure. You and your visitor will be asked to wear a mask once you enter the hospital.   Patient reports does not currently have any new symptoms concerning for COVID-19 and no household members with COVID-19 like illness.     Reviewed procedure/mask/visitor instructions with patient.

## 2021-09-10 ENCOUNTER — Other Ambulatory Visit: Payer: Self-pay

## 2021-09-10 ENCOUNTER — Encounter (HOSPITAL_COMMUNITY): Payer: Self-pay | Admitting: Cardiology

## 2021-09-10 ENCOUNTER — Encounter (HOSPITAL_COMMUNITY)
Admission: RE | Disposition: A | Discharge status: Home / Self Care | Payer: Self-pay | Source: Ambulatory Visit | Attending: Cardiovascular Disease

## 2021-09-10 ENCOUNTER — Ambulatory Visit (HOSPITAL_COMMUNITY)
Admission: RE | Admit: 2021-09-10 | Discharge: 2021-09-11 | Disposition: A | Discharge status: Home / Self Care | Payer: Medicare PPO | Source: Ambulatory Visit | Attending: Cardiovascular Disease | Admitting: Cardiovascular Disease

## 2021-09-10 DIAGNOSIS — E7800 Pure hypercholesterolemia, unspecified: Secondary | ICD-10-CM | POA: Insufficient documentation

## 2021-09-10 DIAGNOSIS — I493 Ventricular premature depolarization: Secondary | ICD-10-CM | POA: Insufficient documentation

## 2021-09-10 DIAGNOSIS — I420 Dilated cardiomyopathy: Secondary | ICD-10-CM | POA: Insufficient documentation

## 2021-09-10 DIAGNOSIS — Z955 Presence of coronary angioplasty implant and graft: Secondary | ICD-10-CM | POA: Insufficient documentation

## 2021-09-10 DIAGNOSIS — E785 Hyperlipidemia, unspecified: Secondary | ICD-10-CM

## 2021-09-10 DIAGNOSIS — H5461 Unqualified visual loss, right eye, normal vision left eye: Secondary | ICD-10-CM | POA: Diagnosis not present

## 2021-09-10 DIAGNOSIS — I2510 Atherosclerotic heart disease of native coronary artery without angina pectoris: Secondary | ICD-10-CM | POA: Insufficient documentation

## 2021-09-10 HISTORY — PX: LEFT HEART CATH AND CORONARY ANGIOGRAPHY: CATH118249

## 2021-09-10 HISTORY — PX: CORONARY STENT INTERVENTION: CATH118234

## 2021-09-10 SURGERY — LEFT HEART CATH AND CORONARY ANGIOGRAPHY
Anesthesia: LOCAL

## 2021-09-10 MED ORDER — MIDAZOLAM HCL 2 MG/2ML IJ SOLN
INTRAMUSCULAR | Status: DC | PRN
  Administered 2021-09-10: 2 mg via INTRAVENOUS

## 2021-09-10 MED ORDER — CLOPIDOGREL BISULFATE 300 MG PO TABS
ORAL_TABLET | ORAL | Status: AC
  Filled 2021-09-10: qty 2

## 2021-09-10 MED ORDER — HEPARIN SODIUM (PORCINE) 1000 UNIT/ML IJ SOLN
INTRAMUSCULAR | Status: AC
  Filled 2021-09-10: qty 10

## 2021-09-10 MED ORDER — SODIUM CHLORIDE 0.9 % IV BOLUS
INTRAVENOUS | Status: AC | PRN
  Administered 2021-09-10: 250 mL via INTRAVENOUS

## 2021-09-10 MED ORDER — MIDAZOLAM HCL 2 MG/2ML IJ SOLN
INTRAMUSCULAR | Status: AC
  Filled 2021-09-10: qty 2

## 2021-09-10 MED ORDER — ASPIRIN EC 81 MG PO TBEC
81.0000 mg | DELAYED_RELEASE_TABLET | Freq: Every day | ORAL | Status: DC
  Administered 2021-09-11: 81 mg via ORAL
  Filled 2021-09-10: qty 1

## 2021-09-10 MED ORDER — VERAPAMIL HCL 2.5 MG/ML IV SOLN
INTRAVENOUS | Status: AC
  Filled 2021-09-10: qty 2

## 2021-09-10 MED ORDER — HEPARIN SODIUM (PORCINE) 1000 UNIT/ML IJ SOLN
INTRAMUSCULAR | Status: DC | PRN
  Administered 2021-09-10: 6000 [IU] via INTRAVENOUS
  Administered 2021-09-10: 2000 [IU] via INTRAVENOUS
  Administered 2021-09-10: 5500 [IU] via INTRAVENOUS

## 2021-09-10 MED ORDER — SODIUM CHLORIDE 0.9% FLUSH
3.0000 mL | Freq: Two times a day (BID) | INTRAVENOUS | Status: DC
  Administered 2021-09-10 – 2021-09-11 (×2): 3 mL via INTRAVENOUS

## 2021-09-10 MED ORDER — SODIUM CHLORIDE 0.9% FLUSH: 3.0000 mL | INTRAVENOUS | Status: DC | PRN

## 2021-09-10 MED ORDER — NITROGLYCERIN 1 MG/10 ML FOR IR/CATH LAB
INTRA_ARTERIAL | Status: DC | PRN
  Administered 2021-09-10: 200 ug via INTRACORONARY

## 2021-09-10 MED ORDER — VERAPAMIL HCL 2.5 MG/ML IV SOLN
INTRAVENOUS | Status: DC | PRN
  Administered 2021-09-10: 2 mg via INTRAVENOUS

## 2021-09-10 MED ORDER — HEPARIN (PORCINE) IN NACL 1000-0.9 UT/500ML-% IV SOLN
INTRAVENOUS | Status: AC
  Filled 2021-09-10: qty 500

## 2021-09-10 MED ORDER — SODIUM CHLORIDE 0.9% FLUSH: 3.0000 mL | Freq: Two times a day (BID) | INTRAVENOUS | Status: DC

## 2021-09-10 MED ORDER — FENTANYL CITRATE (PF) 100 MCG/2ML IJ SOLN
INTRAMUSCULAR | Status: DC | PRN
  Administered 2021-09-10: 25 ug via INTRAVENOUS

## 2021-09-10 MED ORDER — METOPROLOL SUCCINATE ER 25 MG PO TB24
25.0000 mg | ORAL_TABLET | Freq: Every day | ORAL | Status: DC
  Administered 2021-09-10 – 2021-09-11 (×2): 25 mg via ORAL
  Filled 2021-09-10 (×2): qty 1

## 2021-09-10 MED ORDER — AMIODARONE HCL 200 MG PO TABS
400.0000 mg | ORAL_TABLET | Freq: Two times a day (BID) | ORAL | Status: DC
  Administered 2021-09-10 – 2021-09-11 (×2): 400 mg via ORAL
  Filled 2021-09-10 (×2): qty 2

## 2021-09-10 MED ORDER — SODIUM CHLORIDE 0.9 % IV SOLN: 250.0000 mL | INTRAVENOUS | Status: DC | PRN

## 2021-09-10 MED ORDER — ACETAMINOPHEN 325 MG PO TABS: 650.0000 mg | ORAL_TABLET | ORAL | Status: DC | PRN

## 2021-09-10 MED ORDER — ASPIRIN 81 MG PO CHEW: 81.0000 mg | CHEWABLE_TABLET | ORAL | Status: DC

## 2021-09-10 MED ORDER — EZETIMIBE 10 MG PO TABS
10.0000 mg | ORAL_TABLET | Freq: Every day | ORAL | Status: DC
Start: 2021-09-10 — End: 2021-09-11
  Administered 2021-09-10 – 2021-09-11 (×2): 10 mg via ORAL
  Filled 2021-09-10 (×2): qty 1

## 2021-09-10 MED ORDER — ONDANSETRON HCL 4 MG/2ML IJ SOLN: 4.0000 mg | Freq: Four times a day (QID) | INTRAMUSCULAR | Status: DC | PRN

## 2021-09-10 MED ORDER — HEPARIN (PORCINE) IN NACL 1000-0.9 UT/500ML-% IV SOLN
INTRAVENOUS | Status: DC | PRN
  Administered 2021-09-10 (×2): 500 mL

## 2021-09-10 MED ORDER — IOHEXOL 350 MG/ML SOLN
INTRAVENOUS | Status: DC | PRN
  Administered 2021-09-10: 220 mL

## 2021-09-10 MED ORDER — SODIUM CHLORIDE 0.9 % WEIGHT BASED INFUSION: 1.0000 mL/kg/h | INTRAVENOUS | Status: DC

## 2021-09-10 MED ORDER — CLOPIDOGREL BISULFATE 75 MG PO TABS
75.0000 mg | ORAL_TABLET | Freq: Every day | ORAL | Status: DC
  Administered 2021-09-11: 75 mg via ORAL
  Filled 2021-09-10: qty 1

## 2021-09-10 MED ORDER — NITROGLYCERIN 1 MG/10 ML FOR IR/CATH LAB
INTRA_ARTERIAL | Status: AC
  Filled 2021-09-10: qty 10

## 2021-09-10 MED ORDER — ROSUVASTATIN CALCIUM 20 MG PO TABS
40.0000 mg | ORAL_TABLET | Freq: Every day | ORAL | Status: DC
  Administered 2021-09-11: 40 mg via ORAL
  Filled 2021-09-10: qty 2

## 2021-09-10 MED ORDER — VERAPAMIL HCL 2.5 MG/ML IV SOLN
INTRAVENOUS | Status: DC | PRN
  Administered 2021-09-10: 10 mL via INTRA_ARTERIAL

## 2021-09-10 MED ORDER — HYDRALAZINE HCL 20 MG/ML IJ SOLN: 10.0000 mg | INTRAMUSCULAR | Status: AC | PRN

## 2021-09-10 MED ORDER — CLOPIDOGREL BISULFATE 300 MG PO TABS
ORAL_TABLET | ORAL | Status: DC | PRN
  Administered 2021-09-10: 600 mg via ORAL

## 2021-09-10 MED ORDER — LIDOCAINE HCL (PF) 1 % IJ SOLN
INTRAMUSCULAR | Status: AC
  Filled 2021-09-10: qty 30

## 2021-09-10 MED ORDER — LABETALOL HCL 5 MG/ML IV SOLN
10.0000 mg | INTRAVENOUS | Status: AC | PRN
  Filled 2021-09-10: qty 4

## 2021-09-10 MED ORDER — SODIUM CHLORIDE 0.9 % WEIGHT BASED INFUSION
3.0000 mL/kg/h | INTRAVENOUS | Status: AC
  Administered 2021-09-10: 3 mL/kg/h via INTRAVENOUS

## 2021-09-10 MED ORDER — FENTANYL CITRATE (PF) 100 MCG/2ML IJ SOLN
INTRAMUSCULAR | Status: AC
  Filled 2021-09-10: qty 2

## 2021-09-10 MED ORDER — LIDOCAINE HCL (PF) 1 % IJ SOLN
INTRAMUSCULAR | Status: DC | PRN
  Administered 2021-09-10: 2 mL

## 2021-09-10 MED ORDER — SODIUM CHLORIDE 0.9 % IV SOLN: INTRAVENOUS | Status: AC

## 2021-09-10 SURGICAL SUPPLY — 20 items
BALLN SAPPHIRE 2.5X12 (BALLOONS) ×2
BALLN SAPPHIRE 3.0X12 (BALLOONS) ×2
BALLOON SAPPHIRE 2.5X12 (BALLOONS) IMPLANT
BALLOON SAPPHIRE 3.0X12 (BALLOONS) IMPLANT
CATH INFINITI 5 FR JL3.5 (CATHETERS) ×1 IMPLANT
CATH LAUNCHER 6FR AL.75 (CATHETERS) ×1 IMPLANT
CATH OPTITORQUE TIG 4.0 5F (CATHETERS) ×1 IMPLANT
CATH VISTA GUIDE 6FR JR4 (CATHETERS) ×1 IMPLANT
DEVICE RAD COMP TR BAND LRG (VASCULAR PRODUCTS) ×1 IMPLANT
GLIDESHEATH SLEND SS 6F .021 (SHEATH) ×1 IMPLANT
GUIDEWIRE INQWIRE 1.5J.035X260 (WIRE) IMPLANT
INQWIRE 1.5J .035X260CM (WIRE) ×2
KIT ENCORE 26 ADVANTAGE (KITS) ×1 IMPLANT
KIT HEART LEFT (KITS) ×2 IMPLANT
PACK CARDIAC CATHETERIZATION (CUSTOM PROCEDURE TRAY) ×2 IMPLANT
STENT ONYX FRONTIER 4.0X18 (Permanent Stent) ×1 IMPLANT
SYR MEDRAD MARK 7 150ML (SYRINGE) ×2 IMPLANT
TRANSDUCER W/STOPCOCK (MISCELLANEOUS) ×2 IMPLANT
TUBING CIL FLEX 10 FLL-RA (TUBING) ×2 IMPLANT
WIRE ASAHI PROWATER 180CM (WIRE) ×1 IMPLANT

## 2021-09-10 NOTE — Interval H&P Note (Signed)
History and Physical Interval Note:  09/10/2021 4:23 PM  Carlos Suarez  has presented today for surgery, with the diagnosis of abnormal calcium score, Dilated Cardiomyopathy.  The various methods of treatment have been discussed with the patient and family. After consideration of risks, benefits and other options for treatment, the patient has consented to  Procedure(s): LEFT HEART CATH AND CORONARY ANGIOGRAPHY (N/A)  PERCUTANEOUS CORONARY INTERVENTION  as a surgical intervention.  The patient's history has been reviewed, patient examined, no change in status, stable for surgery.  I have reviewed the patient's chart and labs.  Questions were answered to the patient's satisfaction.    Cath Lab Visit (complete for each Cath Lab visit)  Clinical Evaluation Leading to the Procedure:   ACS: Yes.    Non-ACS:    Anginal Classification: No Symptoms  Anti-ischemic medical therapy: Minimal Therapy (1 class of medications)  Non-Invasive Test Results: Equivocal test results  Prior CABG: No previous CABG   Carlos Suarez

## 2021-09-11 ENCOUNTER — Other Ambulatory Visit (HOSPITAL_COMMUNITY): Payer: Self-pay

## 2021-09-11 ENCOUNTER — Encounter (HOSPITAL_COMMUNITY): Payer: Self-pay | Admitting: Cardiology

## 2021-09-11 DIAGNOSIS — I2510 Atherosclerotic heart disease of native coronary artery without angina pectoris: Secondary | ICD-10-CM | POA: Diagnosis not present

## 2021-09-11 DIAGNOSIS — I493 Ventricular premature depolarization: Secondary | ICD-10-CM | POA: Diagnosis not present

## 2021-09-11 DIAGNOSIS — I420 Dilated cardiomyopathy: Secondary | ICD-10-CM | POA: Diagnosis not present

## 2021-09-11 DIAGNOSIS — E7800 Pure hypercholesterolemia, unspecified: Secondary | ICD-10-CM | POA: Diagnosis not present

## 2021-09-11 DIAGNOSIS — Z955 Presence of coronary angioplasty implant and graft: Secondary | ICD-10-CM

## 2021-09-11 DIAGNOSIS — H5461 Unqualified visual loss, right eye, normal vision left eye: Secondary | ICD-10-CM | POA: Diagnosis not present

## 2021-09-11 DIAGNOSIS — E785 Hyperlipidemia, unspecified: Secondary | ICD-10-CM

## 2021-09-11 LAB — POCT ACTIVATED CLOTTING TIME: Activated Clotting Time: 287 seconds

## 2021-09-11 LAB — CBC
HCT: 38.2 % — ABNORMAL LOW (ref 39.0–52.0)
Hemoglobin: 12.4 g/dL — ABNORMAL LOW (ref 13.0–17.0)
MCH: 30.5 pg (ref 26.0–34.0)
MCHC: 32.5 g/dL (ref 30.0–36.0)
MCV: 93.9 fL (ref 80.0–100.0)
Platelets: 141 10*3/uL — ABNORMAL LOW (ref 150–400)
RBC: 4.07 MIL/uL — ABNORMAL LOW (ref 4.22–5.81)
RDW: 12.4 % (ref 11.5–15.5)
WBC: 6.5 10*3/uL (ref 4.0–10.5)
nRBC: 0 % (ref 0.0–0.2)

## 2021-09-11 LAB — BASIC METABOLIC PANEL
Anion gap: 9 (ref 5–15)
BUN: 12 mg/dL (ref 8–23)
CO2: 22 mmol/L (ref 22–32)
Calcium: 8.7 mg/dL — ABNORMAL LOW (ref 8.9–10.3)
Chloride: 107 mmol/L (ref 98–111)
Creatinine, Ser: 1.11 mg/dL (ref 0.61–1.24)
GFR, Estimated: >60 mL/min (ref >60)
Glucose, Bld: 93 mg/dL (ref 70–99)
Potassium: 3.9 mmol/L (ref 3.5–5.1)
Sodium: 138 mmol/L (ref 135–145)

## 2021-09-11 LAB — HEPATIC FUNCTION PANEL
ALT: 7 U/L (ref 0–44)
AST: 14 U/L — ABNORMAL LOW (ref 15–41)
Albumin: 3.3 g/dL — ABNORMAL LOW (ref 3.5–5.0)
Alkaline Phosphatase: 39 U/L (ref 38–126)
Bilirubin, Direct: 0.1 mg/dL (ref 0.0–0.2)
Indirect Bilirubin: 0.8 mg/dL (ref 0.3–0.9)
Total Bilirubin: 0.9 mg/dL (ref 0.3–1.2)
Total Protein: 5.5 g/dL — ABNORMAL LOW (ref 6.5–8.1)

## 2021-09-11 LAB — TSH: TSH: 3.29 u[IU]/mL (ref 0.350–4.500)

## 2021-09-11 MED ORDER — EMPAGLIFLOZIN 10 MG PO TABS
10.0000 mg | ORAL_TABLET | Freq: Every day | ORAL | 11 refills | Status: DC
  Filled 2021-09-11: qty 30, 30d supply, fill #0

## 2021-09-11 MED ORDER — NITROGLYCERIN 0.4 MG SL SUBL
0.4000 mg | SUBLINGUAL_TABLET | SUBLINGUAL | 0 refills | Status: AC | PRN
Start: 2021-09-11 — End: ?
  Filled 2021-09-11: qty 25, 30d supply, fill #0

## 2021-09-11 MED ORDER — ROSUVASTATIN CALCIUM 40 MG PO TABS
40.0000 mg | ORAL_TABLET | Freq: Every day | ORAL | 3 refills | Status: DC
  Filled 2021-09-11: qty 90, 90d supply, fill #0

## 2021-09-11 MED ORDER — AMIODARONE HCL 200 MG PO TABS
400.0000 mg | ORAL_TABLET | Freq: Two times a day (BID) | ORAL | 0 refills | Status: DC
  Filled 2021-09-11: qty 58, 30d supply, fill #0

## 2021-09-11 MED ORDER — METOPROLOL SUCCINATE ER 25 MG PO TB24
25.0000 mg | ORAL_TABLET | Freq: Every day | ORAL | 11 refills | Status: DC
  Filled 2021-09-11: qty 30, 30d supply, fill #0

## 2021-09-11 MED ORDER — CLOPIDOGREL BISULFATE 75 MG PO TABS
75.0000 mg | ORAL_TABLET | Freq: Every day | ORAL | 3 refills | Status: DC
  Filled 2021-09-11: qty 90, 90d supply, fill #0

## 2021-09-11 MED ORDER — NITROGLYCERIN 0.4 MG SL SUBL: 0.4000 mg | SUBLINGUAL_TABLET | SUBLINGUAL | Status: DC | PRN

## 2021-09-11 NOTE — Plan of Care (Signed)
  Problem: Activity: Goal: Ability to return to baseline activity level will improve Outcome: Progressing   Problem: Cardiovascular: Goal: Ability to achieve and maintain adequate cardiovascular perfusion will improve Outcome: Progressing Goal: Vascular access site(s) Level 0-1 will be maintained Outcome: Progressing   

## 2021-09-11 NOTE — TOC Benefit Eligibility Note (Signed)
Patient Product/process development scientist completed.    The patient is currently admitted and upon discharge could be taking Jardiance 10 mg.  The current 30 day co-pay is, $40.00.   The patient is currently admitted and upon discharge could be taking Farxiga 10 mg.  The current 30 day co-pay is, $64.00.   The patient is insured through [facility] Medicare Part D     Roland Earl, CPhT Pharmacy Patient Advocate Specialist [facility] Patient Advocate Team Direct Number: [phone]  Fax: [phone]

## 2021-09-11 NOTE — Progress Notes (Signed)
CARDIAC REHAB PHASE I   PRE:  Rate/Rhythm: 84 SR with PVCs  BP:  Sitting: 140/81      SaO2: 98 RA  MODE:  Ambulation: 2000 ft   POST:  Rate/Rhythm: 88 SR with PVCs  BP:  Sitting: 150/79    SaO2: 98 RA   Pt ambulated 2053ft in hallway independently with steady gait. Pt denies CP, SOB, or dizziness. Pt returned to bed. Stent education completed with pt and daughter. Pt educated on importance of ASA, Plavix, and statin. Pt given stent card and heart healthy diet. Encouraged daily weights. Reviewed site care, restrictions, and exercise guidelines. Will refer to CRP II [place].   [id number] Reynold Bowen, RN BSN 09/11/2021 10:02 AM

## 2021-09-11 NOTE — Discharge Summary (Addendum)
Discharge Summary    Patient ID: Carlos Suarez MRN: [id number]; DOB: 1946-05-05  Admit date: 09/10/2021 Discharge date: 09/11/2021  PCP:  Deland Pretty, MD   [facility] Providers Cardiologist:  Sanda Klein, MD     Discharge Diagnoses    Principal Problem:   Presence of drug coated stent in right coronary artery Active Problems:   Dilated cardiomyopathy [facility])   Coronary artery calcification of native artery   Frequent PVCs   Hyperlipidemia  Diagnostic Studies/Procedures    Cath: 09/10/2021   Mid RCA lesion is 85% stenosed.   A drug-eluting stent was successfully placed using a STENT ONYX FRONTIER 4.0X18.   Post intervention, there is a 0% residual stenosis.   --------------------------------------------   Dist LAD lesion is 45% stenosed.   Ramus-1 lesion is 50% stenosed. Ramus-2 lesion is 50% stenosed.   --------------------------------------------   There is mild to moderate left ventricular systolic dysfunction.  The left ventricular ejection fraction is 35-45% by visual estimate.   LV end diastolic pressure is normal.   There is no aortic valve stenosis.   SUMMARY Diffuse coronary disease with severe single-vessel disease, mid RCA 85% stenosis, ramus intermedius tandem 50 to 60% stenoses Successful DES PCI of mid RCA focal 85% stenosis - reducing to 0%: Synergy DES 4.0 mm x 18 mm deployed to 4.1 mm TIMI-3 flow pre and post Mildly reduced LVEF of roughly 40 to 45%-inferior hypokinesis. Normal LVEDP     RECOMMENDATIONS Monitor post PCI overnight.  Anticipate morning discharge Start low-dose Toprol, increase statin to 40 mg Based on this of significance of PVCs and reduced EF, will load with amiodarone 4 mg twice daily with plan for 1 week followed by 20 mg twice daily for 1 week and then 200 mg daily. Uninterrupted DAPT (CLOPIDOGREL 75 MG, ASPIRIN 81 MG) X 6 months min    Glenetta Hew, MD  Diagnostic Dominance: Right Intervention   _____________    History of Present Illness     Carlos Suarez is a 76 y.o. 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 Sallyanne Kuster for an elevated coronary calcium score (527, 71st percentile) on low-dose CT scanning, that also showed atherosclerosis.   He has never had angina pectoris, either at rest or with activity. Throughout most of the year he has a daily routine where he walks 2 miles.  He usually outpaces his wife and does extra laps.  He denied exertional dyspnea.  Over the summer he did notice increased fatigue, but blamed this on the heat and on "getting old".  His baseline ECG showed sinus rhythm with left anterior fascicular block, without any Q waves or repolarization abnormalities.  He uses a combination of rosuvastatin and ezetimibe for his hypercholesterolemia.  He denied palpitations, syncope, orthopnea, PND, lower extremity edema, claudication, focal neurological complaints other than right eye blindness.   He was initially scheduled for a plain ECG treadmill stress test, but when he presented he had baseline ECG changes and it was felt by the DOD that this would interfere with his ECG tracing evaluation.  He was therefore switched to a pharmacological nuclear scan.  This showed a normal pattern of myocardial perfusion, but surprisingly showed a decreased LVEF of only 40%.  An echocardiogram was performed to confirm this and showed an LVEF of 45-50%.  The ventricle was also dilated with an end-diastolic diameter of 6.1 cm.  The left atrium was also markedly dilated at 4.9 cm.  The echo  was felt to show hypokinesis of the mid-apical inferoseptum and inferior wall, although on review by Dr. Sallyanne Kuster it was felt the left ventricular dysfunction was global.  It was noted that he had frequent ventricular ectopy throughout the study. Given findings, he was set up for outpatient cardiac cath.   Hospital Course     CAD: Underwent cardiac cath noted above with 85% mRCA  lesion treated with PCI/DES x1. Placed on DAPT with ASA/plavix for at least 6 months. No complications noted overnight. Seen by CR  HFrEF: EF noted at 35-45% with inferior hypokinesis. No signs of volume overload  -- started on Toprol 25mg  daily, Jardiance 10mg  daily  -- consider addition of ARB/spiro at outpatient follow up appt   HLD: Crestor was increased from 20 to 40mg  daily prior to DC -- continue Zetia -- will need LFTs/FLP in 8 weeks -- thinks he has had some myalgias with the statin but willing to try increase. May need [facility] referral as an outpatient  PVCs: given the significance of PVCs and reduced EF, patient was started on amiodarone with taper of 400mg  BID for one week, then 200mg  BID for one week, then 200mg  daily   General: Well developed, well nourished, male appearing in no acute distress. Head: Normocephalic, atraumatic.  Neck: Supple without bruits, JVD. Lungs:  Resp regular and unlabored, CTA. Heart: RRR, S1, S2, no S3, S4, or murmur; no rub. Abdomen: Soft, non-tender, non-distended with normoactive bowel sounds. No hepatomegaly. No rebound/guarding. No obvious abdominal masses. Extremities: No clubbing, cyanosis, edema. Distal pedal pulses are 2+ bilaterally. Right radial cath site stable without bruising or hematoma Neuro: Alert and oriented X 3. Moves all extremities spontaneously. Psych: Normal affect.  Patient was seen by Dr. Ali Lowe and deemed stable for discharge home. Follow up in the office has been arranged. Medications sent to the [facility] pharmacy. Educated by pharmD prior to DC.  Did the patient have an acute coronary syndrome (MI, NSTEMI, STEMI, etc) this admission?:  No                               Did the patient have a percutaneous coronary intervention (stent / angioplasty)?:  Yes.     Cath/PCI Registry Performance & Quality Measures: Aspirin prescribed? - Yes ADP Receptor Inhibitor (Plavix/Clopidogrel, Brilinta/Ticagrelor or Effient/Prasugrel)  prescribed (includes medically managed patients)? - Yes High Intensity Statin (Lipitor 40-80mg  or Crestor 20-40mg ) prescribed? - Yes For EF <40%, was ACEI/ARB prescribed? - No - Reason:  consider starting at follow up appt if BP stable For EF <40%, Aldosterone Antagonist (Spironolactone or Eplerenone) prescribed? - No - Reason:  consider at outpatient follow up if BP remains stable Cardiac Rehab Phase II ordered? - Yes    The patient will be scheduled for a TOC follow up appointment in 10-14 days.  A message has been sent to the [facility] and Scheduling Pool at the office where the patient should be seen for follow up.  _____________  Discharge Vitals Blood pressure 127/76, pulse 73, temperature 98.3 F (36.8 C), temperature source Oral, resp. rate 17, height 6\' 1"  (1.854 m), weight 107.8 kg, SpO2 98 %.  Filed Weights   09/10/21 1207 09/10/21 2023  Weight: 105.7 kg 107.8 kg    Labs & Radiologic Studies    CBC Recent Labs    09/11/21 0200  WBC 6.5  HGB 12.4*  HCT 38.2*  MCV 93.9  PLT 141*   Basic  Metabolic Panel Recent Labs    09/11/21 0200  NA 138  K 3.9  CL 107  CO2 22  GLUCOSE 93  BUN 12  CREATININE 1.11  CALCIUM 8.7*   Liver Function Tests Recent Labs    09/11/21 0200  AST 14*  ALT 7  ALKPHOS 39  BILITOT 0.9  PROT 5.5*  ALBUMIN 3.3*   No results for input(s): LIPASE, AMYLASE in the last 72 hours. High Sensitivity Troponin:   No results for input(s): TROPONINIHS in the last 720 hours.  BNP Invalid input(s): POCBNP D-Dimer No results for input(s): DDIMER in the last 72 hours. Hemoglobin A1C No results for input(s): HGBA1C in the last 72 hours. Fasting Lipid Panel No results for input(s): CHOL, HDL, LDLCALC, TRIG, CHOLHDL, LDLDIRECT in the last 72 hours. Thyroid Function Tests Recent Labs    09/11/21 0200  TSH 3.290   _____________  CARDIAC CATHETERIZATION  Result Date: 09/10/2021   Mid RCA lesion is 85% stenosed.   A drug-eluting stent was  successfully placed using a STENT ONYX FRONTIER 4.0X18.   Post intervention, there is a 0% residual stenosis.   --------------------------------------------   Dist LAD lesion is 45% stenosed.   Ramus-1 lesion is 50% stenosed. Ramus-2 lesion is 50% stenosed.   --------------------------------------------   There is mild to moderate left ventricular systolic dysfunction.  The left ventricular ejection fraction is 35-45% by visual estimate.   LV end diastolic pressure is normal.   There is no aortic valve stenosis. SUMMARY Diffuse coronary disease with severe single-vessel disease, mid RCA 85% stenosis, ramus intermedius tandem 50 to 60% stenoses Successful DES PCI of mid RCA focal 85% stenosis - reducing to 0%: Synergy DES 4.0 mm x 18 mm deployed to 4.1 mm TIMI-3 flow pre and post Mildly reduced LVEF of roughly 40 to 45%-inferior hypokinesis. Normal LVEDP RECOMMENDATIONS Monitor post PCI overnight.  Anticipate morning discharge Start low-dose Toprol, increase statin to 40 mg Based on this of significance of PVCs and reduced EF, will load with amiodarone 4 mg twice daily with plan for 1 week followed by 20 mg twice daily for 1 week and then 200 mg daily. Uninterrupted DAPT (CLOPIDOGREL 75 MG, ASPIRIN 81 MG) X 6 months min Glenetta Hew, MD  ECHOCARDIOGRAM COMPLETE  Result Date: 08/23/2021    ECHOCARDIOGRAM REPORT   Patient Name:   Carlos Suarez Date of Exam: 08/22/2021 Medical Rec #:  [id number]       Height:       72.0 in Accession #:    [id number]      Weight:       246.0 lb Date of Birth:  02-Sep-1945       BSA:          2.327 m Patient Age:    59 years        BP:           140/82 mmHg Patient Gender: M               HR:           80 bpm. Exam Location:  [place] Procedure: 2D Echo, Cardiac Doppler and Color Doppler Indications:    Abnormal nuclear cardiac imaging test R93.01  History:        Patient has no prior history of Echocardiogram examinations.                 Risk Factors:Hypercholesterol. LV  perfusion is normal.  _ Left ventricular function is abnormal. Global function is                 moderately reduced. Nuclear stress EF: 40 %. The left                 ventricular ejection fraction is moderately decreased (30-44%).                 End diastolic cavity size is mildly enlarged.  Sonographer:    Lenard Galloway BA, RDCS Referring Phys: [place]  1. Left ventricular ejection fraction, by estimation, is 45 to 50%. The left ventricle has mildly decreased function. The left ventricle demonstrates regional wall motion abnormalities (see scoring diagram/findings for description). The left ventricular  internal cavity size was moderately dilated. Left ventricular diastolic parameters are indeterminate. There is hypokinesis of the left ventricular, mid-apical inferoseptal wall and inferior wall.  2. Right ventricular systolic function is normal. The right ventricular size is normal. There is normal pulmonary artery systolic pressure.  3. The mitral valve is normal in structure. Trivial mitral valve regurgitation. No evidence of mitral stenosis.  4. The aortic valve is tricuspid. Aortic valve regurgitation is mild. No aortic stenosis is present.  5. The inferior vena cava is normal in size with greater than 50% respiratory variability, suggesting right atrial pressure of 3 mmHg. Comparison(s): No prior Echocardiogram. Conclusion(s)/Recommendation(s): Frequent ectopy throughout study, reduced sensitivity for focal wall motion abnormalities, especially in anterior and anterolateral walls. FINDINGS  Left Ventricle: Left ventricular ejection fraction, by estimation, is 45 to 50%. The left ventricle has mildly decreased function. The left ventricle demonstrates regional wall motion abnormalities. The left ventricular internal cavity size was moderately dilated. There is no left ventricular hypertrophy. Left ventricular diastolic parameters are indeterminate. Right Ventricle: The  right ventricular size is normal. No increase in right ventricular wall thickness. Right ventricular systolic function is normal. There is normal pulmonary artery systolic pressure. The tricuspid regurgitant velocity is 1.94 m/s, and  with an assumed right atrial pressure of 3 mmHg, the estimated right ventricular systolic pressure is [id number] mmHg. Left Atrium: Left atrial size was normal in size. Right Atrium: Right atrial size was not well visualized. Pericardium: There is no evidence of pericardial effusion. Mitral Valve: The mitral valve is normal in structure. Trivial mitral valve regurgitation. No evidence of mitral valve stenosis. Tricuspid Valve: The tricuspid valve is normal in structure. Tricuspid valve regurgitation is trivial. No evidence of tricuspid stenosis. Aortic Valve: The aortic valve is tricuspid. Aortic valve regurgitation is mild. Aortic regurgitation PHT measures 465 msec. No aortic stenosis is present. Aortic valve mean gradient measures 4.0 mmHg. Aortic valve peak gradient measures 5.8 mmHg. Aortic  valve area, by VTI measures 3.32 cm. Pulmonic Valve: The pulmonic valve was grossly normal. Pulmonic valve regurgitation is not visualized. No evidence of pulmonic stenosis. Aorta: The aortic root, ascending aorta and aortic arch are all structurally normal, with no evidence of dilitation or obstruction. Venous: The inferior vena cava is normal in size with greater than 50% respiratory variability, suggesting right atrial pressure of 3 mmHg. IAS/Shunts: The interatrial septum was not well visualized.  LEFT VENTRICLE PLAX 2D LVIDd:         6.10 cm   Diastology LVIDs:         4.50 cm   LV e' medial:    6.67 cm/s LV PW:         1.00 cm   LV E/e' medial:  6.3 LV IVS:        1.10 cm   LV e' lateral:   7.62 cm/s LVOT diam:     2.30 cm   LV E/e' lateral: 5.5 LV SV:         75 LV SV Index:   32 LVOT Area:     4.15 cm                           3D Volume EF:                          3D EF:        47 %                           LV EDV:       198 ml                          LV ESV:       105 ml                          LV SV:        93 ml RIGHT VENTRICLE RV S prime:     11.03 cm/s TAPSE (M-mode): 1.7 cm LEFT ATRIUM             Index LA diam:        4.90 cm 2.11 cm/m LA Vol (A2C):   40.2 ml 17.28 ml/m LA Vol (A4C):   51.7 ml 22.22 ml/m LA Biplane Vol: 47.6 ml 20.46 ml/m  AORTIC VALVE AV Area (Vmax):    3.36 cm AV Area (Vmean):   3.06 cm AV Area (VTI):     3.32 cm AV Vmax:           120.72 cm/s AV Vmean:          91.120 cm/s AV VTI:            0.225 m AV Peak Grad:      5.8 mmHg AV Mean Grad:      4.0 mmHg LVOT Vmax:         97.53 cm/s LVOT Vmean:        67.067 cm/s LVOT VTI:          0.180 m LVOT/AV VTI ratio: 0.80 AI PHT:            465 msec  AORTA Ao Root diam: 3.60 cm Ao Asc diam:  3.50 cm MITRAL VALVE               TRICUSPID VALVE MV Area (PHT): 2.74 cm    TR Peak grad:   15.1 mmHg MV Decel Time: 277 msec    TR Vmax:        194.00 cm/s MV E velocity: 42.20 cm/s MV A velocity: 65.80 cm/s  SHUNTS MV E/A ratio:  0.64        Systemic VTI:  0.18 m                            Systemic Diam: 2.30 cm Buford Dresser MD Electronically signed by Buford Dresser MD Signature Date/Time: 08/23/2021/12:49:05 PM    Final    Disposition   Pt is being discharged home today in good  condition.  Follow-up Plans & Appointments     Follow-up Information     Croitoru, Mihai, MD Follow up on 10/01/2021.   Specialty: Cardiology Why: at 10:30am for your follow up appt Contact information: [address] [place] [address] [phone]                Discharge Instructions     Amb Referral to Cardiac Rehabilitation   Complete by: As directed    Diagnosis: Coronary Stents   After initial evaluation and assessments completed: Virtual Based Care may be provided alone or in conjunction with Phase 2 Cardiac Rehab based on patient barriers.: Yes   Call MD for:  difficulty breathing,  headache or visual disturbances   Complete by: As directed    Call MD for:  persistant dizziness or light-headedness   Complete by: As directed    Call MD for:  redness, tenderness, or signs of infection (pain, swelling, redness, odor or green/yellow discharge around incision site)   Complete by: As directed    Diet - low sodium heart healthy   Complete by: As directed    Discharge instructions   Complete by: As directed    Radial Site Care Refer to this sheet in the next few weeks. These instructions provide you with information on caring for yourself after your procedure. Your caregiver may also give you more specific instructions. Your treatment has been planned according to current medical practices, but problems sometimes occur. Call your caregiver if you have any problems or questions after your procedure. HOME CARE INSTRUCTIONS You may shower the day after the procedure. Remove the bandage (dressing) and gently wash the site with plain soap and water. Gently pat the site dry.  Do not apply powder or lotion to the site.  Do not submerge the affected site in water for 3 to 5 days.  Inspect the site at least twice daily.  Do not flex or bend the affected arm for 24 hours.  No lifting over 5 pounds (2.3 kg) for 5 days after your procedure.  Do not drive home if you are discharged the same day of the procedure. Have someone else drive you.  You may drive 24 hours after the procedure unless otherwise instructed by your caregiver.  What to expect: Any bruising will usually fade within 1 to 2 weeks.  Blood that collects in the tissue (hematoma) may be painful to the touch. It should usually decrease in size and tenderness within 1 to 2 weeks.  SEEK IMMEDIATE MEDICAL CARE IF: You have unusual pain at the radial site.  You have redness, warmth, swelling, or pain at the radial site.  You have drainage (other than a small amount of blood on the dressing).  You have chills.  You have a fever  or persistent symptoms for more than 72 hours.  You have a fever and your symptoms suddenly get worse.  Your arm becomes pale, cool, tingly, or numb.  You have heavy bleeding from the site. Hold pressure on the site.   PLEASE DO NOT MISS ANY DOSES OF YOUR PLAVIX!!!!! Also keep a log of you blood pressures and bring back to your follow up appt. Please call the office with any questions.   Patients taking blood thinners should generally stay away from medicines like ibuprofen, Advil, Motrin, naproxen, and Aleve due to risk of stomach bleeding. You may take Tylenol as directed or talk to your primary doctor about alternatives.   PLEASE ENSURE THAT  YOU DO NOT RUN OUT OF YOUR PLAVIX. This medication is very important to remain on for at least 6 months. IF you have issues obtaining this medication due to cost please CALL the office 3-5 business days prior to running out in order to prevent missing doses of this medication.   Increase activity slowly   Complete by: As directed        Discharge Medications   Allergies as of 09/11/2021       Reactions   Fluothane [halothane] Nausea And Vomiting   Oxycodone Other (See Comments)   confusion        Medication List     TAKE these medications    amiodarone 400 MG tablet Commonly known as: PACERONE Take 1 tablet (400 mg total) by mouth 2 (two) times daily. 400mg  twice daily for 1 week followed by 200mg  twice daily for one week then 200mg  once daily   aspirin EC 81 MG tablet Take 81 mg by mouth daily. Swallow whole.   clopidogrel 75 MG tablet Commonly known as: PLAVIX Take 1 tablet (75 mg total) by mouth daily with breakfast.   empagliflozin 10 MG Tabs tablet Commonly known as: Jardiance Take 1 tablet (10 mg total) by mouth daily before breakfast.   ezetimibe 10 MG tablet Commonly known as: ZETIA Take 10 mg by mouth daily.   metoprolol succinate 25 MG 24 hr tablet Commonly known as: TOPROL-XL Take 1 tablet (25 mg total) by  mouth daily.   nitroGLYCERIN 0.4 MG SL tablet Commonly known as: NITROSTAT Place 1 tablet (0.4 mg total) under the tongue every 5 (five) minutes as needed for chest pain.   rosuvastatin 40 MG tablet Commonly known as: CRESTOR Take 1 tablet (40 mg total) by mouth daily. What changed:  medication strength how much to take           Outstanding Labs/Studies   FLP/LFTs in 8 weeks   Duration of Discharge Encounter   Greater than 30 minutes including physician time.  Signed, Reino Bellis, NP 09/11/2021, 11:03 AM   ATTENDING ATTESTATION:  After conducting a review of all available clinical information with the care team, interviewing the patient, and performing a physical exam, I agree with the findings and plan described in this note.   GEN: No acute distress.   Cardiac: RRR, no murmurs, rubs, or gallops.  Respiratory: Clear to auscultation bilaterally. GI: Soft, nontender, non-distended  MS: No edema; No deformity. Neuro:  Nonfocal  Vasc:  +2 radial pulses  Patient doing well after uncomplicated RCA PCI.  Discharge today on DAPT, statin, Jardiance, Toprol, and amiodarone.  Follow up with outpatient Cardiology scheduled.  Lenna Sciara, MD Pager [phone]

## 2021-09-17 ENCOUNTER — Telehealth (HOSPITAL_COMMUNITY): Payer: Self-pay

## 2021-09-17 DIAGNOSIS — I493 Ventricular premature depolarization: Secondary | ICD-10-CM | POA: Diagnosis not present

## 2021-09-17 DIAGNOSIS — Z955 Presence of coronary angioplasty implant and graft: Secondary | ICD-10-CM | POA: Diagnosis not present

## 2021-09-17 DIAGNOSIS — I700 Atherosclerosis of aorta: Secondary | ICD-10-CM | POA: Diagnosis not present

## 2021-09-17 DIAGNOSIS — Z09 Encounter for follow-up examination after completed treatment for conditions other than malignant neoplasm: Secondary | ICD-10-CM | POA: Diagnosis not present

## 2021-09-17 DIAGNOSIS — I420 Dilated cardiomyopathy: Secondary | ICD-10-CM | POA: Diagnosis not present

## 2021-09-17 DIAGNOSIS — E7800 Pure hypercholesterolemia, unspecified: Secondary | ICD-10-CM | POA: Diagnosis not present

## 2021-09-17 NOTE — Telephone Encounter (Signed)
Pt insurance is active and benefits verified through [employer] Co-pay $20, DED 0/0 met, out of pocket $4,000/$54.79 met, co-insurance 0%. no pre-authorization required. [employer], 09/17/2021@11:47am, REF# [id number] °  °Will contact patient to see if he is interested in the Cardiac Rehab Program. If interested, patient will need to complete follow up appt. Once completed, patient will be contacted for scheduling upon review by the RN Navigator. °

## 2021-09-17 NOTE — Telephone Encounter (Signed)
Called patient to see if he is interested in the Cardiac Rehab Program. Patient expressed interest. Explained scheduling process and went over insurance, patient verbalized understanding. Will contact patient for scheduling once f/u has been completed.  °

## 2021-09-22 ENCOUNTER — Other Ambulatory Visit (HOSPITAL_COMMUNITY): Payer: Self-pay

## 2021-10-01 ENCOUNTER — Ambulatory Visit: Payer: Medicare PPO | Admitting: Cardiovascular Disease

## 2021-10-01 ENCOUNTER — Other Ambulatory Visit: Payer: Self-pay

## 2021-10-01 ENCOUNTER — Encounter: Payer: Self-pay | Admitting: Cardiovascular Disease

## 2021-10-01 VITALS — BP 132/74 | HR 83 | Ht 73.0 in | Wt 239.2 lb

## 2021-10-01 DIAGNOSIS — R21 Rash and other nonspecific skin eruption: Secondary | ICD-10-CM | POA: Diagnosis not present

## 2021-10-01 DIAGNOSIS — I454 Nonspecific intraventricular block: Secondary | ICD-10-CM

## 2021-10-01 DIAGNOSIS — I493 Ventricular premature depolarization: Secondary | ICD-10-CM

## 2021-10-01 DIAGNOSIS — I255 Ischemic cardiomyopathy: Secondary | ICD-10-CM | POA: Diagnosis not present

## 2021-10-01 DIAGNOSIS — I2510 Atherosclerotic heart disease of native coronary artery without angina pectoris: Secondary | ICD-10-CM | POA: Diagnosis not present

## 2021-10-01 NOTE — Progress Notes (Signed)
Cardiology Office Note:    Date:  10/04/2021   ID:  Carlos Suarez, DOB 29-Nov-1945, MRN [id number]  PCP:  Deland Pretty, MD   [facility] Cardiologist:  Sanda Klein, MD     Referring MD: Deland Pretty, MD   No chief complaint on file.   History of Present Illness:    Carlos Suarez is a 76 y.o. 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 Due to an abnormal stress test and dilated LV with decreased LVEF (45-50% on echo, 40% QGS), he underwent cardiac catheterization that showed a 85% mid RCA stenosis that was stented (Synergy DES 4.0x 18 Sep 10, 2021), also noted to have tandem 50-60% ramus intermedius artery stenoses.  During stress testing and during the heart catheterization he had very frequent PVCs. Amiodarone was initiated at the time of cardiac cath, when he was also started on clopidogrel (for 6 months), Jardiance and metoprolol. Zetia was started relatively recently as well.  He has developed and mildly pruritic, slightly raised erythematous rash on his legs, spreading centrally. It looks like a drug reaction.  His baseline ECG shows sinus rhythm with left anterior fascicular block, without any Q waves or repolarization abnormalities.  He takes a combination of rosuvastatin and ezetimibe for his hypercholesterolemia.  He denies palpitations, syncope, orthopnea, PND, lower extremity edema, claudication, focal neurological complaints other than right eye blindness.  He was initially scheduled for a plain ECG treadmill stress test, but when he presented he had baseline ECG changes and it was felt by the DOD that this would interfere with his ECG tracing evaluation.  He was therefore switched to a pharmacological nuclear scan.  This showed a normal pattern of  myocardial perfusion, but surprisingly showed a decreased LVEF of only 40%.  An echocardiogram was performed to confirm this and showed an LVEF of 45-50%.  The ventricle was also dilated with an end-diastolic diameter of 6.1 cm.  The left atrium was also markedly dilated at 4.9 cm.  The echo was felt to show hypokinesis of the mid-apical inferoseptum and inferior wall, although on my evaluation, I think that the left ventricular dysfunction is global.  It was noted that he had frequent ventricular ectopy throughout the study.  Past Medical History:  Diagnosis Date   Legally blind in right eye, as defined in [place]     Past Surgical History:  Procedure Laterality Date   CORONARY STENT INTERVENTION N/A 09/10/2021   Procedure: CORONARY STENT INTERVENTION;  Surgeon: Leonie Man, MD;  Location: [facility];  Service: Cardiovascular;  Laterality: N/A;   LEFT HEART CATH AND CORONARY ANGIOGRAPHY N/A 09/10/2021   Procedure: LEFT HEART CATH AND CORONARY ANGIOGRAPHY;  Surgeon: Leonie Man, MD;  Location: [facility];  Service: Cardiovascular;  Laterality: N/A;    Current Medications: Current Meds  Medication Sig   aspirin EC 81 MG tablet Take 81 mg by mouth daily. Swallow whole.   clopidogrel (PLAVIX) 75 MG tablet Take 1 tablet (75 mg total) by mouth daily with breakfast.   metoprolol succinate (TOPROL-XL) 25 MG 24 hr tablet Take 1 tablet (25 mg total) by mouth daily.   rosuvastatin (CRESTOR) 40 MG tablet Take 1 tablet (40 mg total) by mouth daily.   [DISCONTINUED] amiodarone (PACERONE) 200 MG tablet Take 2 tablets (400 mg total) by mouth 2 (  two) times daily for 1 week, then 1 tablet twice daily for 1 week, then 1 tablet once daily.   [DISCONTINUED] amiodarone (PACERONE) 200 MG tablet Take 200 mg by mouth daily in the afternoon.   [DISCONTINUED] empagliflozin (JARDIANCE) 10 MG TABS tablet Take 1 tablet (10 mg total) by mouth daily before breakfast.   [DISCONTINUED] ezetimibe (ZETIA)  10 MG tablet Take 10 mg by mouth daily.     Allergies:   Fluothane [halothane] and Oxycodone   Social History   Socioeconomic History   Marital status: Married    Spouse name: Not on file   Number of children: Not on file   Years of education: Not on file   Highest education level: Not on file  Occupational History   Not on file  Tobacco Use   Smoking status: Never   Smokeless tobacco: Never  Vaping Use   Vaping Use: Never used  Substance and Sexual Activity   Alcohol use: Never   Drug use: Never   Sexual activity: Not on file  Other Topics Concern   Not on file  Social History Narrative   Not on file   Social Determinants of Health   Financial Resource Strain: Not on file  Food Insecurity: Not on file  Transportation Needs: Not on file  Physical Activity: Not on file  Stress: Not on file  Social Connections: Not on file     Family History: The patient's only history is significant for the absence of early onset CAD or CHF, known cardiomyopathy or unexplained premature death  ROS:   Please see the history of present illness.     All other systems reviewed and are negative.  EKGs/Labs/Other Studies Reviewed:   Cardiac catheterization   Mid RCA lesion is 85% stenosed.   A drug-eluting stent was successfully placed using a STENT ONYX FRONTIER 4.0X18.   Post intervention, there is a 0% residual stenosis.   --------------------------------------------   Dist LAD lesion is 45% stenosed.   Ramus-1 lesion is 50% stenosed. Ramus-2 lesion is 50% stenosed.   --------------------------------------------   There is mild to moderate left ventricular systolic dysfunction.  The left ventricular ejection fraction is 35-45% by visual estimate.   LV end diastolic pressure is normal.   There is no aortic valve stenosis.   SUMMARY Diffuse coronary disease with severe single-vessel disease, mid RCA 85% stenosis, ramus intermedius tandem 50 to 60% stenoses Successful DES PCI of  mid RCA focal 85% stenosis - reducing to 0%: Synergy DES 4.0 mm x 18 mm deployed to 4.1 mm TIMI-3 flow pre and post Mildly reduced LVEF of roughly 40 to 45%-inferior hypokinesis. Normal LVEDP     RECOMMENDATIONS Monitor post PCI overnight.  Anticipate morning discharge Start low-dose Toprol, increase statin to 40 mg Based on this of significance of PVCs and reduced EF, will load with amiodarone 4 mg twice daily with plan for 1 week followed by 20 mg twice daily for 1 week and then 200 mg daily. Uninterrupted DAPT (CLOPIDOGREL 75 MG, ASPIRIN 81 MG) X 6 months min   The following studies were reviewed today: 08/06/2021 pharmacological nuclear perfusion study   Findings are consistent with no prior ischemia and no prior myocardial infarction. The study is intermediate risk.   No ST deviation was noted.   LV perfusion is normal.   Left ventricular function is abnormal. Global function is moderately reduced. Nuclear stress EF: 40 %. The left ventricular ejection fraction is moderately decreased (30-44%). End diastolic cavity size  is mildly enlarged.   Prior study not available for comparison.   Intermediate risk study with normal myocardial perfusion, but with a dilated left ventricle and moderately reduced global systolic function. Findings suggest nonischemic dilated cardiomyopathy, but gated images could be causing artifactual underestimation of left ventricular function due to frequent PVCs. Recommend correlation with echocardiogram.  Echocardiogram 08/22/2021   1. Left ventricular ejection fraction, by estimation, is 45 to 50%. The  left ventricle has mildly decreased function. The left ventricle  demonstrates regional wall motion abnormalities (see scoring  diagram/findings for description). The left ventricular   internal cavity size was moderately dilated. Left ventricular diastolic  parameters are indeterminate. There is hypokinesis of the left  ventricular, mid-apical  inferoseptal wall and inferior wall.   2. Right ventricular systolic function is normal. The right ventricular  size is normal. There is normal pulmonary artery systolic pressure.   3. The mitral valve is normal in structure. Trivial mitral valve  regurgitation. No evidence of mitral stenosis.   4. The aortic valve is tricuspid. Aortic valve regurgitation is mild. No  aortic stenosis is present.   5. The inferior vena cava is normal in size with greater than 50%  respiratory variability, suggesting right atrial pressure of 3 mmHg.   Comparison(s): No prior Echocardiogram.   Conclusion(s)/Recommendation(s): Frequent ectopy throughout study, reduced  sensitivity for focal wall motion abnormalities, especially in anterior  and anterolateral walls.   EKG:  EKG is ordered today.  It shows sinus rhythm with bigeminal PVCs and a nonspecific IVCD (atypical LBBB) with left axis deviation. QRS 128 ms, QTC 474 ms.  Recent Labs: 09/11/2021: ALT 7; BUN 12; Creatinine, Ser 1.11; Hemoglobin 12.4; Platelets 141; Potassium 3.9; Sodium 138; TSH 3.290  Recent Lipid Panel No results found for: CHOL, TRIG, HDL, CHOLHDL, VLDL, LDLCALC, LDLDIRECT 02/13/2021 02/13/2021   Cholesterol 195, HDL 57, LDL 113 prior to most recent change in medications. Hemoglobin 14.1, normal liver function tests, potassium 5.5, creatinine 1.12  Risk Assessment/Calculations:           Physical Exam:    VS:  BP 132/74 (BP Location: Left Arm, Patient Position: Sitting, Cuff Size: Large)    Pulse 83    Ht 6\' 1"  (1.854 m)    Wt 239 lb 3.2 oz (108.5 kg)    SpO2 100%    BMI 31.56 kg/m     Wt Readings from Last 3 Encounters:  10/01/21 239 lb 3.2 oz (108.5 kg)  09/10/21 237 lb 10.5 oz (107.8 kg)  09/08/21 245 lb (111.1 kg)     General: Alert, oriented x3, no distress, mildly obese Head: opacified right corneea, PERRL, EOMI, no exophtalmos or lid lag, no myxedema, no xanthelasma; normal ears, nose and oropharynx Neck: normal  jugular venous pulsations and no hepatojugular reflux; brisk carotid pulses without delay and no carotid bruits Chest: clear to auscultation, no signs of consolidation by percussion or palpation, normal fremitus, symmetrical and full respiratory excursions Cardiovascular: normal position and quality of the apical impulse, periods of bigeminal rhythm, normal first and second heart sounds, no murmurs, rubs or gallops Abdomen: no tenderness or distention, no masses by palpation, no abnormal pulsatility or arterial bruits, normal bowel sounds, no hepatosplenomegaly Extremities: no clubbing, cyanosis or edema; 2+ radial, ulnar and brachial pulses bilaterally; 2+ right femoral, posterior tibial and dorsalis pedis pulses; 2+ left femoral, posterior tibial and dorsalis pedis pulses; no subclavian or femoral bruits Neurological: grossly nonfocal Psych: Normal mood and affect   ASSESSMENT:  1. Ischemic cardiomyopathy   2. Coronary artery disease involving native coronary artery of native heart without angina pectoris   3. Frequent PVCs   4. IVCD (intraventricular conduction defect)   5. Rash     PLAN:    In order of problems listed above:  CAD: has never had angina, but the significance of CAD is suggested by the depressed EF and regional inferior wall hypokinesis. Now s/p RCA stent. Continue DAPT 6 months, ASA indefinitely. On statin, target LDL<70.  Beta blocker started. Cardiomyopathy appears out of proportion to the extent of the CAD.  Reevaluate LVEF in a couple of months. Cardiomyopathy: Mildly depressed LVEF confirmed by QGS, echo and LV angiography. Dilated LV. Asymptomatic: he does not have any symptoms of congestive heart failure (NYHA functional class I) and he is euvolemic clinically, LVEDP was normal at cath.  Diuretics are not indicated. Might benefit from ARB/ACEi, but want to clarify cause of rash first.   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  PVCs: PVCs may be an expression of the CMP or the cause of the CMP. Stop amiodarone due to rash and other potential side effects. 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  Unlikely to benefit from CRT. Rash: suspect drug rash.  Need to continue clopidogrel and preferably will stay on beta blocker for PVCs and low EF, but will discontinue the other new medications (Jardiance, Zetia, amiodarone).    Cardiac Rehabilitation Eligibility Assessment  The patient is ready to start cardiac rehabilitation from a cardiac standpoint.     Shared Decision Making/Informed Consent The risks [stroke (1 in 1000), death (1 in 1000), kidney failure [usually temporary] (1 in 500), bleeding (1 in 200), allergic reaction [possibly serious] (1 in 200)], benefits (diagnostic support and management of coronary artery disease) and alternatives of a cardiac catheterization were discussed in detail with Mr. Czerniak and he is willing to proceed.    Medication Adjustments/Labs and Tests Ordered: Current medicines are reviewed at length with the patient today.  Concerns regarding medicines are outlined above.  Orders Placed This Encounter  Procedures   EKG 12-Lead   ECHOCARDIOGRAM COMPLETE   No orders of the defined types were placed in this encounter.   Patient Instructions  Medication Instructions:  STOP the Amiodarone STOP the Zetia STOP the Jardiance  *If you need a refill on your cardiac medications before your next appointment, please call your pharmacy*   Lab Work: None ordered If you have labs (blood work) drawn today and your tests are completely normal, you will receive your results only by: [facility] (if you have MyChart) OR A paper copy in the mail If you have any lab test that is abnormal or we need to change your treatment,  we will call you to review the results.   Testing/Procedures: Your physician has requested that you have an echocardiogram in 3 months. Echocardiography is a painless test that uses sound waves to create images of your heart. It provides your doctor with information about the size and shape of your heart and how well your hearts chambers and valves are working. You may receive an ultrasound enhancing agent through an IV if needed to better visualize your heart during the echo.This procedure takes approximately one hour. There are no restrictions for this procedure. This will take place at the  [facility]. [address], [address].    Follow-Up: At [facility], you and your health needs are our priority.  As part of our continuing mission to provide you with exceptional heart care, we have created designated Provider Care Teams.  These Care Teams include your primary Cardiologist (physician) and Advanced Practice Providers (APPs -  Physician Assistants and Nurse Practitioners) who all work together to provide you with the care you need, when you need it.  We recommend signing up for the patient portal called "MyChart".  Sign up information is provided on this After Visit Summary.  MyChart is used to connect with patients for Virtual Visits (Telemedicine).  Patients are able to view lab/test results, encounter notes, upcoming appointments, etc.  Non-urgent messages can be sent to your provider as well.   To learn more about what you can do with MyChart, go to [email or web address].    Your next appointment:   4 month(s)  The format for your next appointment:   In Person  Provider:   Sanda Klein, MD {     Signed, Sanda Klein, MD  10/04/2021 12:29 PM    [place]

## 2021-10-01 NOTE — Patient Instructions (Signed)
Medication Instructions:  STOP the Amiodarone STOP the Zetia STOP the Jardiance  *If you need a refill on your cardiac medications before your next appointment, please call your pharmacy*   Lab Work: None ordered If you have labs (blood work) drawn today and your tests are completely normal, you will receive your results only by: [place] (if you have MyChart) OR A paper copy in the mail If you have any lab test that is abnormal or we need to change your treatment, we will call you to review the results.   Testing/Procedures: Your physician has requested that you have an echocardiogram in 3 months. Echocardiography is a painless test that uses sound waves to create images of your heart. It provides your doctor with information about the size and shape of your heart and how well your hearts chambers and valves are working. You may receive an ultrasound enhancing agent through an IV if needed to better visualize your heart during the echo.This procedure takes approximately one hour. There are no restrictions for this procedure. This will take place at the [address].    Follow-Up: At [facility], you and your health needs are our priority.  As part of our continuing mission to provide you with exceptional heart care, we have created designated Provider Care Teams.  These Care Teams include your primary Cardiologist (physician) and Advanced Practice Providers (APPs -  Physician Assistants and Nurse Practitioners) who all work together to provide you with the care you need, when you need it.  We recommend signing up for the patient portal called "MyChart".  Sign up information is provided on this After Visit Summary.  MyChart is used to connect with patients for Virtual Visits (Telemedicine).  Patients are able to view lab/test results, encounter notes, upcoming appointments, etc.  Non-urgent messages can be sent to your provider as well.   To learn more about what  you can do with MyChart, go to [email or web address].    Your next appointment:   4 month(s)  The format for your next appointment:   In Person  Provider:   Sanda Klein, MD {

## 2021-10-02 ENCOUNTER — Other Ambulatory Visit (HOSPITAL_COMMUNITY): Payer: Self-pay

## 2021-10-04 DIAGNOSIS — I255 Ischemic cardiomyopathy: Secondary | ICD-10-CM | POA: Insufficient documentation

## 2021-10-04 DIAGNOSIS — I454 Nonspecific intraventricular block: Secondary | ICD-10-CM | POA: Insufficient documentation

## 2021-10-29 ENCOUNTER — Telehealth (HOSPITAL_COMMUNITY): Payer: Self-pay

## 2021-10-29 NOTE — Telephone Encounter (Signed)
Called and spoke with pt in regards to CR, pt stated he is walking at home. ?  ?Closed referral ?

## 2021-12-02 DIAGNOSIS — H26492 Other secondary cataract, left eye: Secondary | ICD-10-CM | POA: Diagnosis not present

## 2021-12-02 DIAGNOSIS — H5212 Myopia, left eye: Secondary | ICD-10-CM | POA: Diagnosis not present

## 2021-12-17 ENCOUNTER — Other Ambulatory Visit: Payer: Self-pay | Admitting: Internal Medicine

## 2021-12-17 DIAGNOSIS — Z955 Presence of coronary angioplasty implant and graft: Secondary | ICD-10-CM | POA: Diagnosis not present

## 2021-12-17 DIAGNOSIS — R6884 Jaw pain: Secondary | ICD-10-CM | POA: Diagnosis not present

## 2021-12-19 ENCOUNTER — Other Ambulatory Visit: Payer: Self-pay | Admitting: Internal Medicine

## 2021-12-19 DIAGNOSIS — R6884 Jaw pain: Secondary | ICD-10-CM

## 2021-12-29 ENCOUNTER — Ambulatory Visit (HOSPITAL_COMMUNITY): Payer: Medicare PPO | Attending: Internal Medicine

## 2021-12-29 ENCOUNTER — Encounter: Payer: Self-pay | Admitting: Cardiovascular Disease

## 2021-12-29 ENCOUNTER — Other Ambulatory Visit (HOSPITAL_COMMUNITY): Payer: Medicare PPO

## 2021-12-29 DIAGNOSIS — I255 Ischemic cardiomyopathy: Secondary | ICD-10-CM | POA: Diagnosis not present

## 2021-12-29 LAB — ECHOCARDIOGRAM COMPLETE
Area-P 1/2: 5.06 cm2
P 1/2 time: 392 msec
S' Lateral: 3.6 cm

## 2022-01-02 DIAGNOSIS — R519 Headache, unspecified: Secondary | ICD-10-CM | POA: Diagnosis not present

## 2022-01-02 DIAGNOSIS — H5461 Unqualified visual loss, right eye, normal vision left eye: Secondary | ICD-10-CM | POA: Diagnosis not present

## 2022-01-14 ENCOUNTER — Ambulatory Visit
Admission: RE | Admit: 2022-01-14 | Discharge: 2022-01-14 | Disposition: A | Discharge status: Home / Self Care | Payer: Medicare PPO | Source: Ambulatory Visit | Attending: Internal Medicine | Admitting: Internal Medicine

## 2022-01-14 DIAGNOSIS — J342 Deviated nasal septum: Secondary | ICD-10-CM | POA: Diagnosis not present

## 2022-01-14 DIAGNOSIS — R208 Other disturbances of skin sensation: Secondary | ICD-10-CM | POA: Diagnosis not present

## 2022-01-14 DIAGNOSIS — K115 Sialolithiasis: Secondary | ICD-10-CM | POA: Diagnosis not present

## 2022-01-14 DIAGNOSIS — Z9889 Other specified postprocedural states: Secondary | ICD-10-CM | POA: Diagnosis not present

## 2022-01-14 DIAGNOSIS — R6884 Jaw pain: Secondary | ICD-10-CM

## 2022-02-01 NOTE — Progress Notes (Unsigned)
Cardiology Office Note:    Date:  02/03/2022   ID:  Carlos Suarez, DOB 12-23-1945, MRN [id number]  PCP:  Deland Pretty, MD   [facility] Providers Cardiologist:  Sanda Klein, MD     Referring MD: Deland Pretty, MD   Chief Complaint  Patient presents with   Coronary Artery Disease   Cardiomyopathy    History of Present Illness:    Carlos Suarez is a 76 y.o. 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 Due to an abnormal stress test and dilated LV with decreased LVEF (45-50% on echo, 40% QGS), he underwent cardiac catheterization that showed a 85% mid RCA stenosis that was stented (Synergy DES 4.0x 18 Sep 10, 2021), also noted to have tandem 50-60% ramus intermedius artery stenoses. Follow up echo in May 2023 showed no improvement in LV function.  During stress testing and during the heart catheterization he had very frequent PVCs. Amiodarone was initiated at the time of cardiac cath, when he was also started on clopidogrel (for 6 months), Jardiance and metoprolol. Zetia was started relatively recently as well.  He developed a pruritic slightly raised erythematous rash after starting amiodarone, ezetimibe and Jardiance, that resolved after discontinuation of these medication.  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  There is late systolic mild-moderate mitral insufficiency.  LVEF indeed remains mildly depressed around 45%, but the pattern appears to be global.  The patient specifically denies any chest pain at rest exertion, dyspnea at rest or with exertion, orthopnea, paroxysmal nocturnal dyspnea, syncope, palpitations, focal neurological deficits, intermittent claudication,  lower extremity edema, unexplained weight gain, cough, hemoptysis or wheezing.   His baseline ECG shows sinus rhythm with left anterior fascicular block, without any Q waves or repolarization abnormalities.     Past Medical History:  Diagnosis Date   Legally blind in right eye, as defined in [place]     Past Surgical History:  Procedure Laterality Date   CORONARY STENT INTERVENTION N/A 09/10/2021   Procedure: CORONARY STENT INTERVENTION;  Surgeon: Leonie Man, MD;  Location: [facility];  Service: Cardiovascular;  Laterality: N/A;   LEFT HEART CATH AND CORONARY ANGIOGRAPHY N/A 09/10/2021   Procedure: LEFT HEART CATH AND CORONARY ANGIOGRAPHY;  Surgeon: Leonie Man, MD;  Location: [facility];  Service: Cardiovascular;  Laterality: N/A;    Current Medications: Current Meds  Medication Sig   aspirin EC 81 MG tablet Take 81 mg by mouth daily. Swallow whole.   rosuvastatin (CRESTOR) 40 MG tablet Take 1 tablet (40 mg total) by mouth daily.   [DISCONTINUED] clopidogrel (PLAVIX) 75 MG tablet Take 1 tablet (75 mg total) by mouth daily with breakfast.   [DISCONTINUED] metoprolol succinate (TOPROL-XL) 25 MG 24 hr tablet Take 1 tablet (25 mg total) by mouth daily.     Allergies:   Fluothane [halothane] and Oxycodone   Social History   Socioeconomic History   Marital status: Married    Spouse name: Not on file   Number of children: Not on file   Years of education: Not on file   Highest education level: Not on file  Occupational History   Not on file  Tobacco Use   Smoking status: Never   Smokeless tobacco: Never  Vaping  Use   Vaping Use: Never used  Substance and Sexual Activity   Alcohol use: Never   Drug use: Never   Sexual activity: Not on file  Other Topics Concern   Not on file  Social History Narrative   Not on file   Social Determinants of Health   Financial Resource Strain: Not on file  Food Insecurity: Not on file  Transportation Needs: Not on  file  Physical Activity: Not on file  Stress: Not on file  Social Connections: Not on file     Family History: The patient's only history is significant for the absence of early onset CAD or CHF, known cardiomyopathy or unexplained premature death  ROS:   Please see the history of present illness.     All other systems reviewed and are negative.  EKGs/Labs/Other Studies Reviewed:   Cardiac catheterization   Mid RCA lesion is 85% stenosed.   A drug-eluting stent was successfully placed using a STENT ONYX FRONTIER 4.0X18.   Post intervention, there is a 0% residual stenosis.   --------------------------------------------   Dist LAD lesion is 45% stenosed.   Ramus-1 lesion is 50% stenosed. Ramus-2 lesion is 50% stenosed.   --------------------------------------------   There is mild to moderate left ventricular systolic dysfunction.  The left ventricular ejection fraction is 35-45% by visual estimate.   LV end diastolic pressure is normal.   There is no aortic valve stenosis.   SUMMARY Diffuse coronary disease with severe single-vessel disease, mid RCA 85% stenosis, ramus intermedius tandem 50 to 60% stenoses Successful DES PCI of mid RCA focal 85% stenosis - reducing to 0%: Synergy DES 4.0 mm x 18 mm deployed to 4.1 mm TIMI-3 flow pre and post Mildly reduced LVEF of roughly 40 to 45%-inferior hypokinesis. Normal LVEDP     RECOMMENDATIONS Monitor post PCI overnight.  Anticipate morning discharge Start low-dose Toprol, increase statin to 40 mg Based on this of significance of PVCs and reduced EF, will load with amiodarone 4 mg twice daily with plan for 1 week followed by 20 mg twice daily for 1 week and then 200 mg daily. Uninterrupted DAPT (CLOPIDOGREL 75 MG, ASPIRIN 81 MG) X 6 months min   The following studies were reviewed today: 08/06/2021 pharmacological nuclear perfusion study   Findings are consistent with no prior ischemia and no prior myocardial infarction. The study  is intermediate risk.   No ST deviation was noted.   LV perfusion is normal.   Left ventricular function is abnormal. Global function is moderately reduced. Nuclear stress EF: 40 %. The left ventricular ejection fraction is moderately decreased (30-44%). End diastolic cavity size is mildly enlarged.   Prior study not available for comparison.   Intermediate risk study with normal myocardial perfusion, but with a dilated left ventricle and moderately reduced global systolic function. Findings suggest nonischemic dilated cardiomyopathy, but gated images could be causing artifactual underestimation of left ventricular function due to frequent PVCs. Recommend correlation with echocardiogram.  Echocardiogram 12/29/2020   1. Left ventricular ejection fraction, by estimation, is 45 to 50%. The left ventricle has mildly decreased function. The left ventricle demonstrates regional wall motion abnormalities (abnormal septal motion). Left ventricular diastolic parameters are consistent with Grade I diastolic dysfunction (impaired relaxation).   2. Right ventricular systolic function is normal. The right ventricular size is normal. There is normal pulmonary artery systolic pressure. The estimated right ventricular systolic pressure is 123456 mmHg.   3. The mitral valve is normal in structure. Mild to moderate mitral  valve regurgitation. No evidence of mitral stenosis.   4. The aortic valve is tricuspid. Aortic valve regurgitation is mild to moderate.   5. Frequent ectopy.   Comparison(s): Similar to prior, valve regurgitation appears worse during post PVC beats.   Echocardiogram 12/29/2021  1. Left ventricular ejection fraction, by estimation, is 45 to 50%. The  left ventricle has mildly decreased function. The left ventricle  demonstrates regional wall motion abnormalities (abnormal septal motion).  Left ventricular diastolic parameters are  consistent with Grade I diastolic dysfunction (impaired  relaxation).   2. Right ventricular systolic function is normal. The right ventricular  size is normal. There is normal pulmonary artery systolic pressure. The  estimated right ventricular systolic pressure is 123456 mmHg.   3. The mitral valve is normal in structure. Mild to moderate mitral valve  regurgitation. No evidence of mitral stenosis.   4. The aortic valve is tricuspid. Aortic valve regurgitation is mild to  moderate.   5. Frequent ectopy.   EKG:  EKG is not ordered today.  It shows sinus rhythm with bigeminal PVCs and a nonspecific IVCD (atypical LBBB) with left axis deviation. QRS 128 ms, QTC 474 ms.  Recent Labs: 09/11/2021: ALT 7; BUN 12; Creatinine, Ser 1.11; Hemoglobin 12.4; Platelets 141; Potassium 3.9; Sodium 138; TSH 3.290  Recent Lipid Panel No results found for: CHOL, TRIG, HDL, CHOLHDL, VLDL, LDLCALC, LDLDIRECT 02/13/2021 02/13/2021   Cholesterol 195, HDL 57, LDL 113 prior to most recent change in medications. Hemoglobin 14.1, normal liver function tests, potassium 5.5, creatinine 1.12  Risk Assessment/Calculations:            Physical Exam:    VS:  BP 134/62 (BP Location: Left Arm, Patient Position: Sitting, Cuff Size: Large)   Pulse 90   Ht 6\' 1"  (1.854 m)   Wt 242 lb 12.8 oz (110.1 kg)   SpO2 96%   BMI 32.03 kg/m     Wt Readings from Last 3 Encounters:  02/03/22 242 lb 12.8 oz (110.1 kg)  10/01/21 239 lb 3.2 oz (108.5 kg)  09/10/21 237 lb 10.5 oz (107.8 kg)     General: Alert, oriented x3, no distress, mildly obese Head: opacified right corneea, PERRL, EOMI, no exophtalmos or lid lag, no myxedema, no xanthelasma; normal ears, nose and oropharynx Neck: normal jugular venous pulsations and no hepatojugular reflux; brisk carotid pulses without delay and no carotid bruits Chest: clear to auscultation, no signs of consolidation by percussion or palpation, normal fremitus, symmetrical and full respiratory excursions Cardiovascular: normal position and  quality of the apical impulse, periods of bigeminal rhythm, normal first and second heart sounds, no murmurs, rubs or gallops Abdomen: no tenderness or distention, no masses by palpation, no abnormal pulsatility or arterial bruits, normal bowel sounds, no hepatosplenomegaly Extremities: no clubbing, cyanosis or edema; 2+ radial, ulnar and brachial pulses bilaterally; 2+ right femoral, posterior tibial and dorsalis pedis pulses; 2+ left femoral, posterior tibial and dorsalis pedis pulses; no subclavian or femoral bruits Neurological: grossly nonfocal Psych: Normal mood and affect   ASSESSMENT:    1. Coronary artery disease involving native coronary artery of native heart without angina pectoris   2. Dilated cardiomyopathy ([place])   3. Frequent PVCs   4. IVCD (intraventricular conduction defect)   5. Rash      PLAN:    In order of problems listed above:  CAD: has never had angina, but the significance of CAD is suggested by the depressed EF and regional inferior wall hypokinesis. Now s/p  RCA stent. Continue DAPT 6 months (stop next month), ASA indefinitely. On statin, target LDL<70.  Beta blocker started. Cardiomyopathy appears out of proportion to the extent of the CAD.   Cardiomyopathy: Mildly depressed LVEF confirmed by QGS, echo and LV angiography. In part, reduced LVEF is due to IVCD. Remains asymptomatic: he does not have any symptoms of congestive heart failure (NYHA functional class I) and he is euvolemic clinically, LVEDP was normal at cath.  Diuretics are not indicated. 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 Consider adding RAAS inhibitors, but would like to gauge the response to suppressing the PVCs PVCs: PVCs may be an expression of the CMP or the cause of the CMP. Stop amiodarone due to rash and other potential side effects. Increase metoprolol dose. Encouraged him to get a commercially available rhythm monitoring device  such as Kardia. 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  Unlikely to benefit from CRT unless becomes broader (and of course if he develops clinical HF). Rash: suspect drug rash.  Resolved. Not sure which of the drugs was responsible (Jardiance, Zetia, amiodarone, but suspect it was the latter).      Medication Adjustments/Labs and Tests Ordered: Current medicines are reviewed at length with the patient today.  Concerns regarding medicines are outlined above.  No orders of the defined types were placed in this encounter.  Meds ordered this encounter  Medications   metoprolol succinate (TOPROL-XL) 50 MG 24 hr tablet    Sig: Take 1 tablet (50 mg total) by mouth daily.    Dispense:  90 tablet    Refill:  1    Patient Instructions  Medication Instructions:  INCREASE the Metoprolol Succinate to 50 mg once daily  STOP the Plavix in one month  *If you need a refill on your cardiac medications before your next appointment, please call your pharmacy*   Lab Work: None ordered If you have labs (blood work) drawn today and your tests are completely normal, you will receive your results only by: [place] (if you have MyChart) OR A paper copy in the mail If you have any lab test that is abnormal or we need to change your treatment, we will call you to review the results.   Testing/Procedures: None ordered   Follow-Up: At [facility], you and your health needs are our priority.  As part of our continuing mission to provide you with exceptional heart care, we have created designated Provider Care Teams.  These Care Teams include your primary Cardiologist (physician) and Advanced Practice Providers (APPs -  Physician Assistants and Nurse Practitioners) who all work together to provide you with the care you need, when you need it.  We recommend signing up for the patient portal called "MyChart".   Sign up information is provided on this After Visit Summary.  MyChart is used to connect with patients for Virtual Visits (Telemedicine).  Patients are able to view lab/test results, encounter notes, upcoming appointments, etc.  Non-urgent messages can be sent to your provider as well.   To learn more about what you can do with MyChart, go to [email or web address].    Your next appointment:   12 month(s)  The format for your next appointment:   In Person  Provider:   Sanda Klein, MD {       Signed, Sanda Klein, MD  02/03/2022 1:05 PM    [facility]

## 2022-02-03 ENCOUNTER — Ambulatory Visit: Payer: Medicare PPO | Admitting: Cardiovascular Disease

## 2022-02-03 ENCOUNTER — Encounter: Payer: Self-pay | Admitting: Cardiovascular Disease

## 2022-02-03 VITALS — BP 134/62 | HR 90 | Ht 73.0 in | Wt 242.8 lb

## 2022-02-03 DIAGNOSIS — I420 Dilated cardiomyopathy: Secondary | ICD-10-CM | POA: Diagnosis not present

## 2022-02-03 DIAGNOSIS — I454 Nonspecific intraventricular block: Secondary | ICD-10-CM

## 2022-02-03 DIAGNOSIS — R21 Rash and other nonspecific skin eruption: Secondary | ICD-10-CM

## 2022-02-03 DIAGNOSIS — I493 Ventricular premature depolarization: Secondary | ICD-10-CM

## 2022-02-03 DIAGNOSIS — I2510 Atherosclerotic heart disease of native coronary artery without angina pectoris: Secondary | ICD-10-CM | POA: Diagnosis not present

## 2022-02-03 MED ORDER — METOPROLOL SUCCINATE ER 50 MG PO TB24: 50.0000 mg | ORAL_TABLET | Freq: Every day | ORAL | 1 refills | Status: DC

## 2022-02-03 NOTE — Patient Instructions (Signed)
Medication Instructions:  INCREASE the Metoprolol Succinate to 50 mg once daily  STOP the Plavix in one month  *If you need a refill on your cardiac medications before your next appointment, please call your pharmacy*   Lab Work: None ordered If you have labs (blood work) drawn today and your tests are completely normal, you will receive your results only by: MyChart Message (if you have MyChart) OR A paper copy in the mail If you have any lab test that is abnormal or we need to change your treatment, we will call you to review the results.   Testing/Procedures: None ordered   Follow-Up: At [facility], you and your health needs are our priority.  As part of our continuing mission to provide you with exceptional heart care, we have created designated Provider Care Teams.  These Care Teams include your primary Cardiologist (physician) and Advanced Practice Providers (APPs -  Physician Assistants and Nurse Practitioners) who all work together to provide you with the care you need, when you need it.  We recommend signing up for the patient portal called "MyChart".  Sign up information is provided on this After Visit Summary.  MyChart is used to connect with patients for Virtual Visits (Telemedicine).  Patients are able to view lab/test results, encounter notes, upcoming appointments, etc.  Non-urgent messages can be sent to your provider as well.   To learn more about what you can do with MyChart, go to [email or web address].    Your next appointment:   12 month(s)  The format for your next appointment:   In Person  Provider:   Thurmon Fair, MD {

## 2022-02-06 ENCOUNTER — Ambulatory Visit: Payer: Medicare PPO | Admitting: Physician Assistant

## 2022-02-06 ENCOUNTER — Encounter: Payer: Self-pay | Admitting: Physician Assistant

## 2022-02-06 VITALS — BP 163/78 | HR 85 | Resp 18 | Ht 72.0 in | Wt 240.0 lb

## 2022-02-06 DIAGNOSIS — G500 Trigeminal neuralgia: Secondary | ICD-10-CM

## 2022-02-06 MED ORDER — GABAPENTIN 100 MG PO CAPS: 100.0000 mg | ORAL_CAPSULE | Freq: Three times a day (TID) | ORAL | 1 refills | Status: DC

## 2022-02-06 NOTE — Patient Instructions (Addendum)
MRI brain with Trigeminal protocol Start gabapentin 100 mg 3 x a day x 2 weeks  Follow up 1 month We have sent a referral to [facility] for your MRI and they will call you directly to schedule your appointment. They are located at [address] [facility]. If you need to contact them directly please call [phone].

## 2022-02-06 NOTE — Progress Notes (Signed)
NEUROLOGY CONSULTATION NOTE  Carlos Suarez MRN: [id number] DOB: [birth year]   Referring provider: [name], MD  Primary care provider: [name], MD   Reason for consult:  facial and maxillary pain   Assessment/Plan:   Right facial pain, maxillary pain likely due to trigeminal neuralgia    This is a very pleasant 76 yo man with 6 month history of unresolving R facial pain, following a V2 pattern, suspicious for trigeminal neuralgia.    MRI head with trigeminal protocol to evaluate for structural abnormalities  Start gabapentin 100 mg tid. Side effects discussed Follow up in 1 month   Subjective:    This is a very pleasant 76 year old man with a history of hypertension, hyperlipidemia, CAD status post stent placement, seen today for evaluation of R facial and maxillary pain. In review, in December, the patient was seen by a dentist due to tooth sensitivity, requiring dental work. At the time, he also reported sensitivity in the right eyebrow in a vertical line down to the R side of the nose and R nasolabial fold.   He tried topical therapy that did not help.  Dentist suggested that they might be sinuses due to light pressure on the right maxillary area causing tenderness. Denies numbness or tingling, being able to reproduce the pain, "at one point, I felt the pain was coming from the neck". The R eyebrow pain is now "coming and going, about a 2 or 3". Maxillary CT was negative for abnormal findings. He tried Flonase and chlorpheniramine for 6 weeks, only improving to 50 percent, but at times is severe. He denies any change in the sense of smell,  cold intolerance, head injury, pain with chewing. Initially the pain was extending to the right temple but this subsided. 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 No recent infection but his wife had shingles 1 months ago.  He was seen by ophthalmology as well, as 50 years ago  he had eye trauma s/ scleral buckle becoming legally blind , but ophthalmic source was ruled out.         PAST MEDICAL HISTORY: Past Medical History:  Diagnosis Date   Legally blind in right eye, as defined in [place]     PAST SURGICAL HISTORY: Past Surgical History:  Procedure Laterality Date   CORONARY STENT INTERVENTION N/A 09/10/2021   Procedure: CORONARY STENT INTERVENTION;  Surgeon: Marykay Lex, MD;  Location: [facility];  Service: Cardiovascular;  Laterality: N/A;   LEFT HEART CATH AND CORONARY ANGIOGRAPHY N/A 09/10/2021   Procedure: LEFT HEART CATH AND CORONARY ANGIOGRAPHY;  Surgeon: Marykay Lex, MD;  Location: [facility];  Service: Cardiovascular;  Laterality: N/A;    MEDICATIONS: Current Outpatient Medications on File Prior to Visit  Medication Sig Dispense Refill   aspirin EC 81 MG tablet Take 81 mg by mouth daily. Swallow whole.     clopidogrel (PLAVIX) 75 MG tablet Take 75 mg by mouth daily.     metoprolol succinate (TOPROL-XL) 50 MG 24 hr tablet Take 1 tablet (50 mg total) by mouth daily. 90 tablet 1   nitroGLYCERIN (NITROSTAT) 0.4 MG SL tablet Place 1 tablet (0.4 mg total) under the tongue every 5 (five) minutes as needed for chest pain. 25 tablet 0   rosuvastatin (CRESTOR) 40 MG tablet Take 1 tablet (40 mg total) by mouth daily. 90 tablet 3   No current facility-administered medications on  file prior to visit.    ALLERGIES: Allergies  Allergen Reactions   Fluothane [Halothane] Nausea And Vomiting   Oxycodone Other (See Comments)    confusion   Oxycodone Hcl Other (See Comments)    FAMILY HISTORY: History reviewed. No pertinent family history.    Objective:    General: No acute distress.  Patient appears well-groomed.   Head:  Normocephalic.  Eyes:R eye legally blind after blunt trauma to the eye .  Neck: supple, no paraspinal tenderness, full range of motion Back: No paraspinal tenderness Heart: regular rate and rhythm Lungs:  Clear to auscultation bilaterally. Vascular: No carotid bruits. Neurological Exam: Mental status: alert and oriented to person, place, and time, recent and remote memory intact, fund of knowledge intact, attention and concentration intact, speech fluent and not dysarthric, language intact. Cranial nerves: CN I: not tested CN II: L pupils equal, round and reactive to light, visual fields intact. R lens opaque.  CN III, IV, VI:  full range of motion, no nystagmus, no ptosis CN V: facial sensation intact. Mild TTP on the R eyebrow, R nasolabial area at the V2 region of the nerve. No pain with chewing or swallowing. Able to differentiate temperature.  CN VII: upper and lower face symmetric CN VIII: hearing intact CN IX, X: gag intact, uvula midline CN XI: sternocleidomastoid and trapezius muscles intact CN XII: tongue midline Bulk & Tone: normal, no fasciculations. Motor:  muscle strength 5/5 throughout Sensation:  Pinprick, temperature and vibratory sensation intact. Deep Tendon Reflexes:  2+ throughout,  toes downgoing.   Finger to nose testing:  Without dysmetria.   Heel to shin:  Without dysmetria.   Gait:  Normal station and stride.  Romberg negative.    Thank you for allowing me to take part in the care of this patient.   Marlowe Kays, PA-C

## 2022-02-09 DIAGNOSIS — E7800 Pure hypercholesterolemia, unspecified: Secondary | ICD-10-CM | POA: Diagnosis not present

## 2022-02-09 DIAGNOSIS — I2510 Atherosclerotic heart disease of native coronary artery without angina pectoris: Secondary | ICD-10-CM | POA: Diagnosis not present

## 2022-02-18 DIAGNOSIS — G500 Trigeminal neuralgia: Secondary | ICD-10-CM | POA: Diagnosis not present

## 2022-02-18 DIAGNOSIS — I700 Atherosclerosis of aorta: Secondary | ICD-10-CM | POA: Diagnosis not present

## 2022-02-18 DIAGNOSIS — B351 Tinea unguium: Secondary | ICD-10-CM | POA: Diagnosis not present

## 2022-02-18 DIAGNOSIS — Z0000 Encounter for general adult medical examination without abnormal findings: Secondary | ICD-10-CM | POA: Diagnosis not present

## 2022-02-18 DIAGNOSIS — I420 Dilated cardiomyopathy: Secondary | ICD-10-CM | POA: Diagnosis not present

## 2022-02-18 DIAGNOSIS — Z23 Encounter for immunization: Secondary | ICD-10-CM | POA: Diagnosis not present

## 2022-02-18 DIAGNOSIS — I2510 Atherosclerotic heart disease of native coronary artery without angina pectoris: Secondary | ICD-10-CM | POA: Diagnosis not present

## 2022-02-18 IMAGING — CT CT CARDIAC CORONARY ARTERY CALCIUM SCORE
3 series · 14 of 20 positions shown, 16 images · non-contrast
Comparison: None.

CLINICAL DATA: Hyperlipidemia

EXAM:
CT CARDIAC CORONARY ARTERY CALCIUM SCORE
TECHNIQUE: Non-contrast imaging through the heart was performed using
prospective ECG gating. Image post processing was performed on an
independent workstation, allowing for quantitative analysis of the
heart and coronary arteries. Note that this exam targets the heart
and the chest was not imaged in its entirety.

[Series 2: calcium scoring 2.00 qr36 bestdiast 73% hrt calciu · axial · 0.37mm/px · z∈[+1496,+1586]mm · 4 of 77 slices shown]
[im 16/77  vessel]
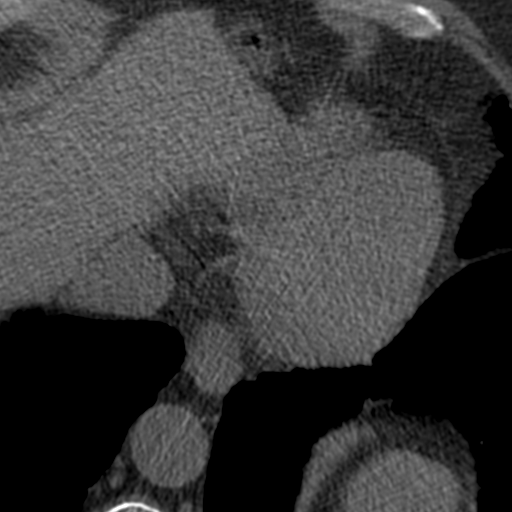
[im 31/77  vessel]
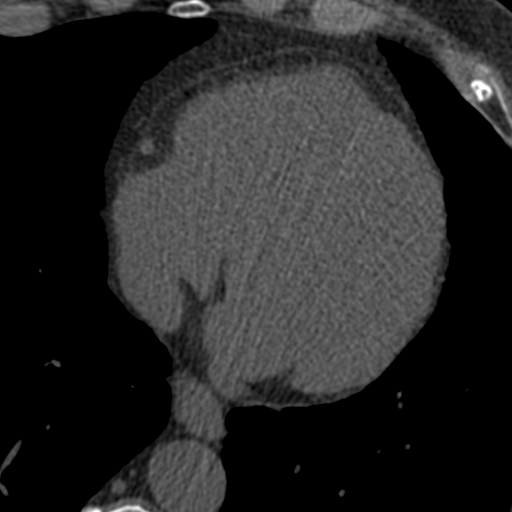
[im 46/77  vessel]
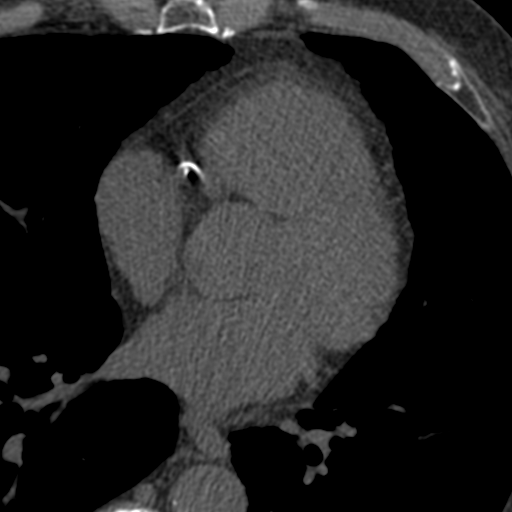
[im 61/77  vessel]
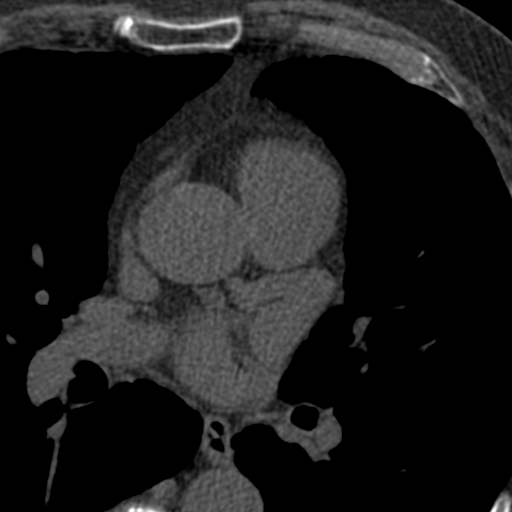

[Series 3: calcium scoring 2.00 br40 bestdiast 73% axial · axial · 0.59mm/px · z∈[+1490,+1592]mm · 5 of 77 slices shown, 7 images]
[im 13/77  vessel]
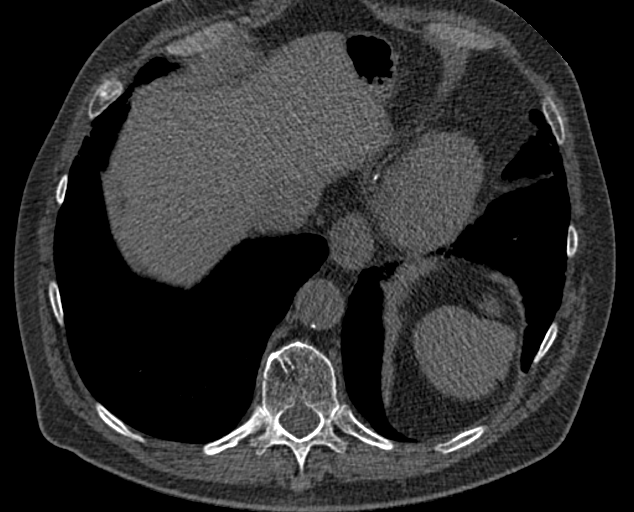
[im 13/77  lung]
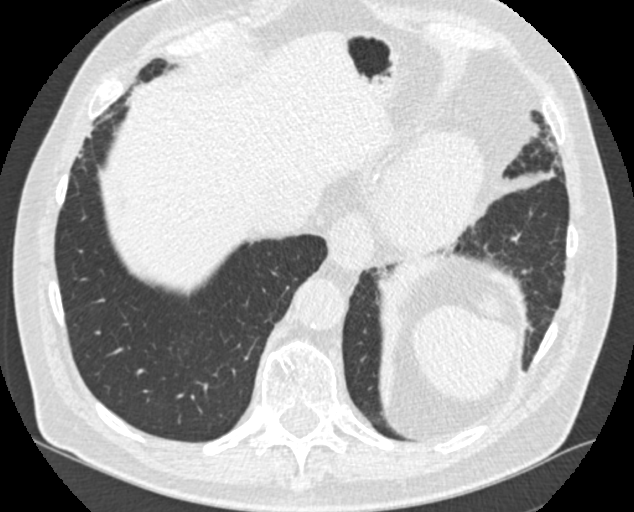
[im 26/77  vessel]
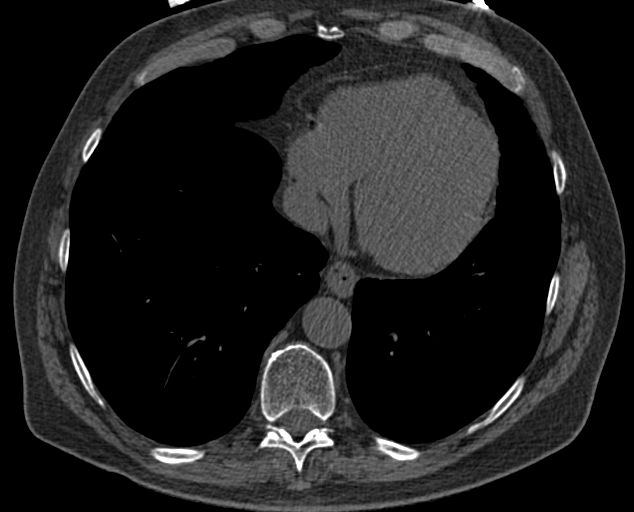
[im 39/77  vessel]
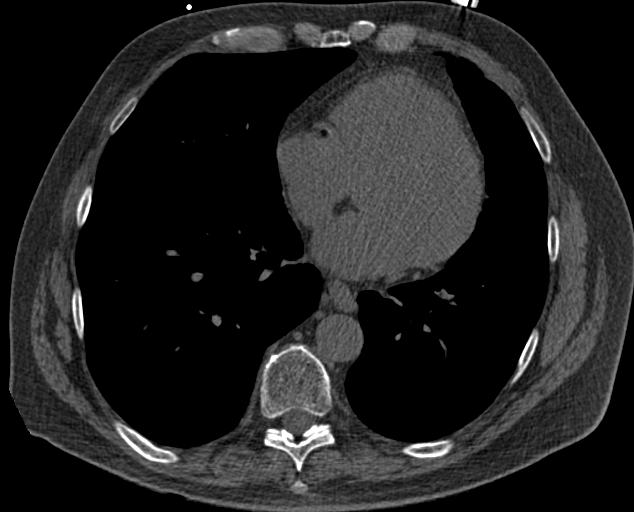
[im 51/77  vessel]
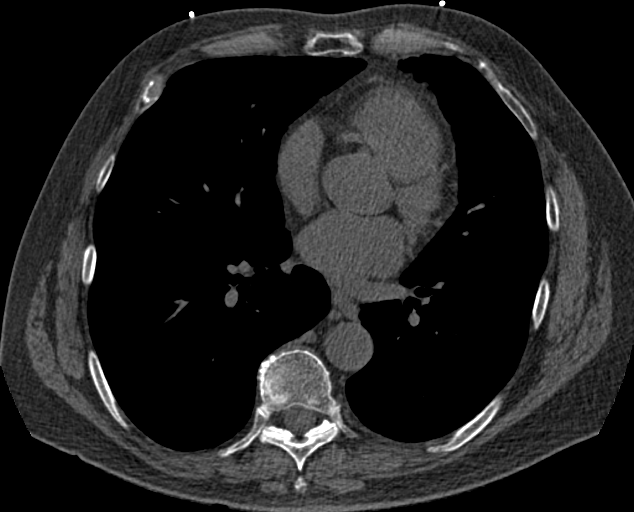
[im 64/77  vessel]
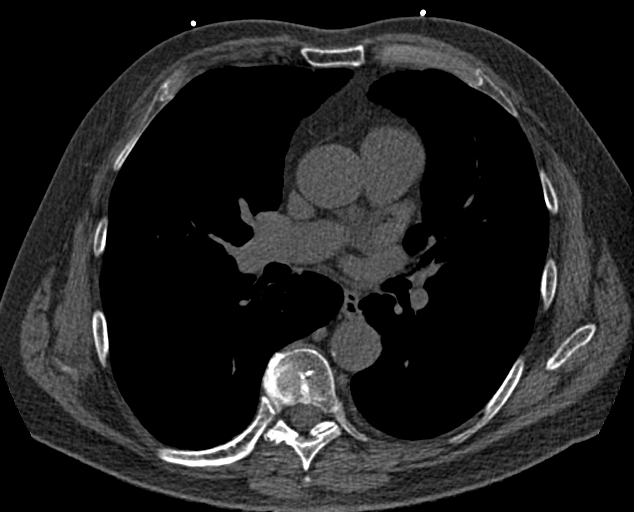
[im 64/77  lung]
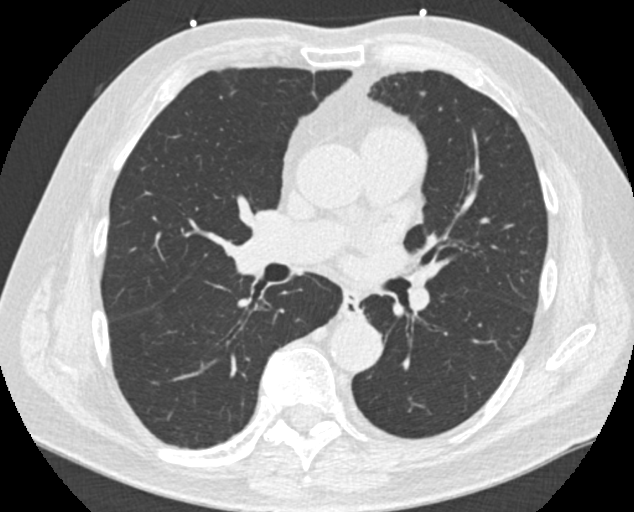

[Series 9: calcium scoring 2.00 br60 bestdiast 73% lungs · axial · 0.59mm/px · z∈[+1490,+1592]mm · 5 of 77 slices shown]
[im 13/77  vessel]
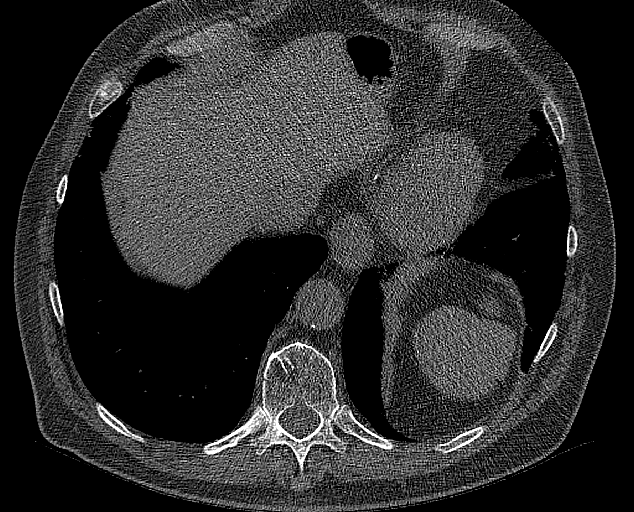
[im 26/77  vessel]
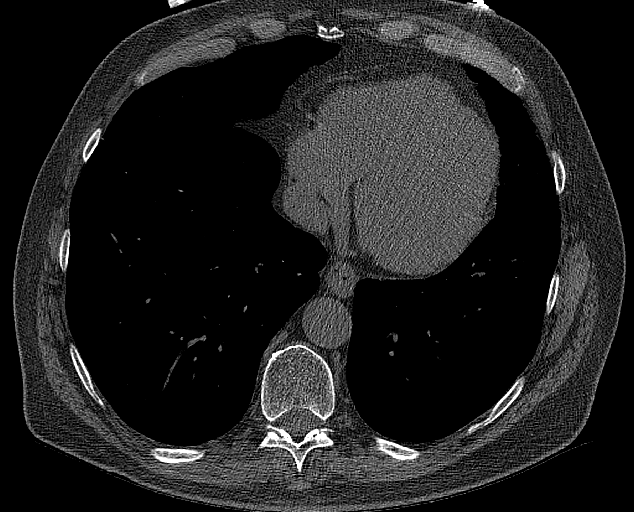
[im 39/77  vessel]
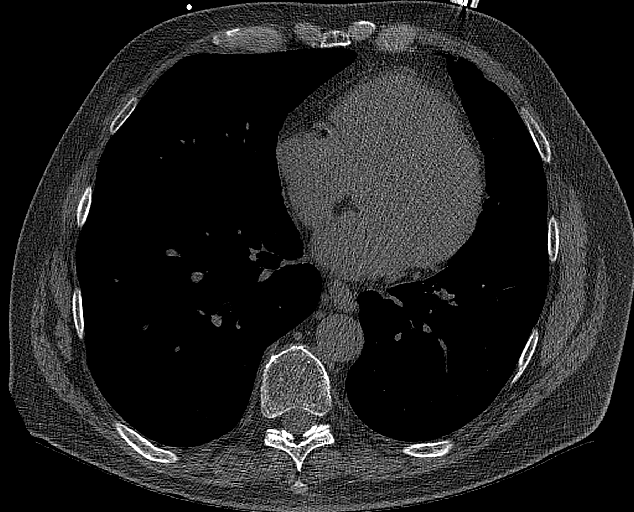
[im 51/77  vessel]
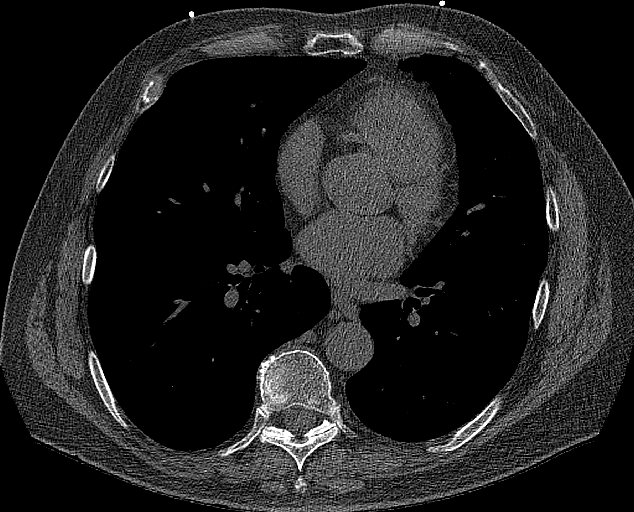
[im 64/77  vessel]
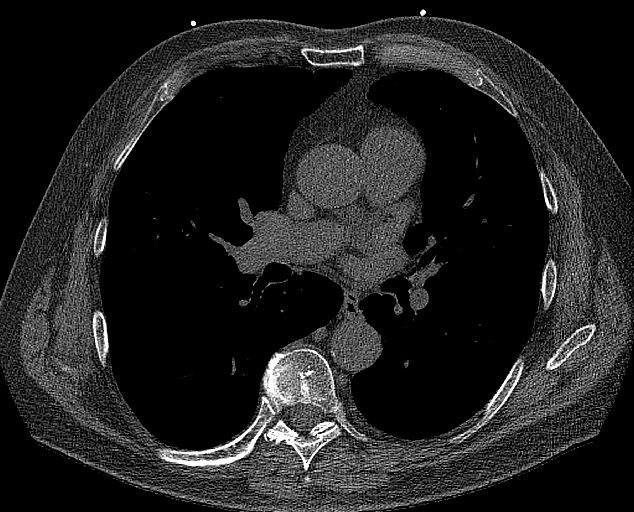

[14 of 20 positions shown; findings below may reference images not displayed]

FINDINGS: CORONARY CALCIUM SCORES:

Left Main: 0

LAD: 117

LCx: 113

RCA: 296

Total Agatston Score: 527

[HOSPITAL] percentile: 71

AORTA MEASUREMENTS:

Ascending Aorta: 37 mm

Descending Aorta: 29 mm

OTHER FINDINGS:

Heart is normal size. Aorta normal caliber. Scattered aortic
atherosclerosis. No adenopathy. Scarring in the right middle lobe
and lingula. No effusions. Imaging into the upper abdomen
demonstrates no acute findings. Chest wall soft tissues are
unremarkable. No acute bony abnormality.
IMPRESSION: Total Agatston score: 527

[HOSPITAL] percentile: 71

Aortic atherosclerosis.

## 2022-02-19 ENCOUNTER — Other Ambulatory Visit: Payer: Self-pay

## 2022-02-19 ENCOUNTER — Other Ambulatory Visit: Payer: Self-pay | Admitting: Physician Assistant

## 2022-02-19 DIAGNOSIS — B351 Tinea unguium: Secondary | ICD-10-CM | POA: Diagnosis not present

## 2022-02-23 ENCOUNTER — Ambulatory Visit
Admission: RE | Admit: 2022-02-23 | Discharge: 2022-02-23 | Disposition: A | Discharge status: Home / Self Care | Payer: Medicare PPO | Source: Ambulatory Visit | Attending: Physician Assistant | Admitting: Physician Assistant

## 2022-02-23 ENCOUNTER — Other Ambulatory Visit: Payer: Self-pay

## 2022-02-23 DIAGNOSIS — G500 Trigeminal neuralgia: Secondary | ICD-10-CM | POA: Diagnosis not present

## 2022-02-23 DIAGNOSIS — J341 Cyst and mucocele of nose and nasal sinus: Secondary | ICD-10-CM | POA: Diagnosis not present

## 2022-02-23 DIAGNOSIS — R413 Other amnesia: Secondary | ICD-10-CM | POA: Diagnosis not present

## 2022-02-23 MED ORDER — GADOBENATE DIMEGLUMINE 529 MG/ML IV SOLN
20.0000 mL | Freq: Once | INTRAVENOUS | Status: AC | PRN
  Administered 2022-02-23: 20 mL via INTRAVENOUS

## 2022-02-23 MED ORDER — GABAPENTIN 100 MG PO CAPS
100.0000 mg | ORAL_CAPSULE | Freq: Three times a day (TID) | ORAL | 1 refills | Status: DC
Start: 2022-02-23 — End: 2022-03-12

## 2022-03-12 ENCOUNTER — Ambulatory Visit: Payer: Medicare PPO | Admitting: Physician Assistant

## 2022-03-12 ENCOUNTER — Encounter: Payer: Self-pay | Admitting: Physician Assistant

## 2022-03-12 VITALS — BP 153/99 | HR 76 | Resp 18 | Ht 73.0 in | Wt 242.0 lb

## 2022-03-12 DIAGNOSIS — G500 Trigeminal neuralgia: Secondary | ICD-10-CM | POA: Diagnosis not present

## 2022-03-12 MED ORDER — GABAPENTIN 100 MG PO CAPS: ORAL_CAPSULE | ORAL | 1 refills | Status: DC

## 2022-03-12 NOTE — Patient Instructions (Signed)
  Continue gabapentin  200 mg 3 x a day  Follow up Aug 17 at 1 pm

## 2022-03-12 NOTE — Progress Notes (Signed)
NEUROLOGY CONSULTATION NOTE  Carlos Suarez MRN: [id number] DOB: 1946/02/26   Referring provider: [name], MD  Primary care provider: [name], MD   Reason for consult:  facial and maxillary pain   Assessment/Plan:   Right facial pain, maxillary pain likely due to trigeminal neuralgia    This is a very pleasant 76 yo man with 7 month history of unresolving R facial pain, following a V2 pattern, suspicious for trigeminal neuralgia.  MRI of the brain to further evaluate the structures of the region, was essentially unremarkable, however dominant left vertebral artery was noted as well as partial bilateral mastoid opacification.  MRA of the face to further evaluate the trigeminal nerve was negative for enhancement or masslike findings.  Of note, the right superior cerebellar artery trace versus next to the right trigeminal nerve just beyond the root entry zone.  There is no evidence of fracture or aggressive bone lesions.  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  He began gabapentin 100 mg 3 times daily, with some relief but without resolution of the symptoms.  He is otherwise doing well, without any other neurological complaints.   Recommendations  Increase gabapentin to 200 mg 3 times daily (the patient prefers to not increase it to 300 mg 3 times daily at this time). Follow-up in 1 month   Subjective:    Patient returns today in follow-up.  He reports that his symptoms although improved, have not yet resolved.  He was taking gabapentin 100 mg 3 times daily which helped relieve some of his symptoms.  He states that he is able to brush his teeth for the last 4 days without pain, and able to sleep on the right-hand side as before.  He sustains still experiences triggers, for example talking, or chewing, or major muscle motion, may worsen the symptoms especially on the right nasal area around the right nasolabial fold.  Otherwise, he denies any new complaints.  His  hearing loss has improved, after removing his wife " the Eustachian tube was clogged ". Important to mention that apparently, during the order for his MRI somehow" instead of hearing loss, it was ordered as memory loss"-he says, He does not have or report memory issues.  The patient is clearly alert, oriented, and does not demonstrate any memory deficiencies, he is very eloquent and able to demonstrate adequate memory if not excellent memory.     Initial visit 02/06/2022 this is a very pleasant 76 year old man with a history of hypertension, hyperlipidemia, CAD status post stent placement, seen today for evaluation of R facial and maxillary pain. In review, in December, the patient was seen by a dentist due to tooth sensitivity, requiring dental work. At the time, he also reported sensitivity in the right eyebrow in a vertical line down to the R side of the nose and R nasolabial fold.   He tried topical therapy that did not help.  Dentist suggested that they might be sinuses due to light pressure on the right maxillary area causing tenderness. Denies numbness or tingling, being able to reproduce the pain, "at one point, I felt the pain was coming from the neck". The R eyebrow pain is now "coming and going, about a 2 or 3". Maxillary CT was negative for abnormal findings. He tried Flonase and chlorpheniramine for 6 weeks, only improving to 50 percent, but at times is severe. He denies any change in the sense of smell,  cold intolerance, head injury, pain with chewing. Initially the pain  was extending to the right temple but this subsided. 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 No recent infection but his wife had shingles 1 months ago.  He was seen by ophthalmology as well, as 50 years ago he had eye trauma s/ scleral buckle becoming legally blind , but ophthalmic source was ruled out.        MRI of the brain 02/23/2022 negative for acute findings,  partial bilateral mastoid opacification, known bilateral cataract resection with probable right scleral buckle, patient is blind on the right eye.  Normal  marrow signal, dominant left vertebral artery, other major intracranial flow voids are preserved.  MRI of the face with trigeminal view with and without contrast 02/23/2022 negative for mass or inflammation, right superior cerebellar artery transverses in close proximity to the right trigeminal nerve near the root entry zone.  PAST MEDICAL HISTORY: Past Medical History:  Diagnosis Date   Legally blind in right eye, as defined in [place]     PAST SURGICAL HISTORY: Past Surgical History:  Procedure Laterality Date   CORONARY STENT INTERVENTION N/A 09/10/2021   Procedure: CORONARY STENT INTERVENTION;  Surgeon: Marykay Lex, MD;  Location: [facility] INVASIVE CV LAB;  Service: Cardiovascular;  Laterality: N/A;   LEFT HEART CATH AND CORONARY ANGIOGRAPHY N/A 09/10/2021   Procedure: LEFT HEART CATH AND CORONARY ANGIOGRAPHY;  Surgeon: Marykay Lex, MD;  Location: [facility] INVASIVE CV LAB;  Service: Cardiovascular;  Laterality: N/A;    MEDICATIONS: Current Outpatient Medications on File Prior to Visit  Medication Sig Dispense Refill   aspirin EC 81 MG tablet Take 81 mg by mouth daily. Swallow whole.     metoprolol succinate (TOPROL-XL) 50 MG 24 hr tablet Take 1 tablet (50 mg total) by mouth daily. 90 tablet 1   rosuvastatin (CRESTOR) 40 MG tablet Take 1 tablet (40 mg total) by mouth daily. 90 tablet 3   clopidogrel (PLAVIX) 75 MG tablet Take 75 mg by mouth daily.     nitroGLYCERIN (NITROSTAT) 0.4 MG SL tablet Place 1 tablet (0.4 mg total) under the tongue every 5 (five) minutes as needed for chest pain. (Patient not taking: Reported on 03/12/2022) 25 tablet 0   No current facility-administered medications on file prior to visit.    ALLERGIES: Allergies  Allergen Reactions   Fluothane [Halothane] Nausea And Vomiting   Oxycodone Other (See Comments)     confusion   Oxycodone Hcl Other (See Comments)    FAMILY HISTORY: History reviewed. No pertinent family history.    Objective:    General: NAD, well-groomed  Head: Normocephalic  Eyes: Right eye legally blind after blunt trauma to the eye Neck: supple, no paraspinal tenderness, full range of motion Back: No paraspinal tenderness Heart: Regular rate and rhythm Lungs: CTA Vascular: No bruits Neurological Exam: Mental status: Alert and oriented to person, place and time, recent and remote memory intact, follows knowledge intact, attention and concentration intact, speech fluent and not dysarthric, language intact.   Cranial nerves: CN I: not tested CN II: L pupils equal, round and reactive to light, visual fields intact. R lens opaque.  CN III, IV, VI: Full ROM, no nystagmus, no proptosis.  He has decreased vision on the right (legally blind. CN V: Facial sensation intact mild (less pronounced than prior exam) TTP on the R eyebrow, right nasolabial area at the V2 region of the nerve, without pain, and at this moment no pain with  chewing or swallowing.  Able to differentiate  patient temperature  CN VII: upper and lower face symmetric CN VIII: Hearing is now intact CN IX, X: Intact gag, uvula midline CN XI: Sternocleidomastoid and trapezius muscles intact CN XII: Tongue midline Bulk & Tone: Normal, no fasciculations  motor: Muscle strength 5 out of 5 throughout  sensation: Pinprick, temperature and vibratory sensation intact. Deep Tendon Reflexes: 2+ throughout, toes downgoing.  Finger to nose testing: Without dysmetria Heel to shin: Without dysmetria.   Gait: Normal station and stride, Romberg negative   Thank you for allowing me to take part in the care of this patient.   Marlowe Kays, PA-C

## 2022-03-26 ENCOUNTER — Telehealth: Payer: Self-pay | Admitting: Physician Assistant

## 2022-03-26 ENCOUNTER — Other Ambulatory Visit: Payer: Self-pay | Admitting: Physician Assistant

## 2022-03-26 MED ORDER — GABAPENTIN 300 MG PO CAPS: 300.0000 mg | ORAL_CAPSULE | Freq: Three times a day (TID) | ORAL | 3 refills | Status: DC

## 2022-03-26 NOTE — Telephone Encounter (Signed)
Pt called an informed that we are increasing the Gabapentin to 300MG  3 x a day new prescription was sent to the pharmacy

## 2022-03-26 NOTE — Telephone Encounter (Signed)
Patient called and said 200 MG 3 x a day is not helping. He'd like to increase his Gabapentin to 300MG  3 x a day  Would prefer 300 MG capsules  [facility] [place]  Please call back to confirm with patient if okay to increase. He has enough at home to increase it until picking up the new prescription but he wants to be sure , PA is okay first.

## 2022-04-01 DIAGNOSIS — I2510 Atherosclerotic heart disease of native coronary artery without angina pectoris: Secondary | ICD-10-CM | POA: Diagnosis not present

## 2022-04-01 DIAGNOSIS — Z6831 Body mass index (BMI) 31.0-31.9, adult: Secondary | ICD-10-CM | POA: Diagnosis not present

## 2022-04-01 DIAGNOSIS — Z8249 Family history of ischemic heart disease and other diseases of the circulatory system: Secondary | ICD-10-CM | POA: Diagnosis not present

## 2022-04-01 DIAGNOSIS — E669 Obesity, unspecified: Secondary | ICD-10-CM | POA: Diagnosis not present

## 2022-04-01 DIAGNOSIS — I10 Essential (primary) hypertension: Secondary | ICD-10-CM | POA: Diagnosis not present

## 2022-04-01 DIAGNOSIS — M792 Neuralgia and neuritis, unspecified: Secondary | ICD-10-CM | POA: Diagnosis not present

## 2022-04-01 DIAGNOSIS — E785 Hyperlipidemia, unspecified: Secondary | ICD-10-CM | POA: Diagnosis not present

## 2022-04-01 DIAGNOSIS — Z7982 Long term (current) use of aspirin: Secondary | ICD-10-CM | POA: Diagnosis not present

## 2022-04-16 ENCOUNTER — Ambulatory Visit: Payer: Medicare PPO | Admitting: Physician Assistant

## 2022-04-16 ENCOUNTER — Encounter: Payer: Self-pay | Admitting: Physician Assistant

## 2022-04-16 VITALS — BP 135/78 | HR 74 | Resp 18 | Ht 73.0 in | Wt 246.0 lb

## 2022-04-16 DIAGNOSIS — G500 Trigeminal neuralgia: Secondary | ICD-10-CM

## 2022-04-16 MED ORDER — GABAPENTIN 300 MG PO CAPS
300.0000 mg | ORAL_CAPSULE | Freq: Three times a day (TID) | ORAL | 3 refills | Status: DC
Start: 2022-04-16 — End: 2022-06-19

## 2022-04-16 NOTE — Patient Instructions (Signed)
  Continue gabapentin 300 mg 3 times a day  Follow up  in 2 months

## 2022-04-16 NOTE — Progress Notes (Signed)
NEUROLOGY CONSULTATION NOTE  Carlos Suarez MRN: [id number] DOB: Dec 19, 1945   Referring provider: [name], MD  Primary care provider: [name], MD   Reason for consult:  facial and maxillary pain   Assessment/Plan:   Right facial pain, maxillary pain likely due to trigeminal neuralgia    This is a delightful 76 yo man with 7 month history of unresolving right facial pain, following a V2 pattern, suspicious for trigeminal neuralgia.  Prior MRI of the brain was essentially unremarkable, with a dominant left vertebral artery as well as noted partial bilateral mastoid opacification.  MRA of the face to further evaluate the trigeminal nerve was negative for enhancement or masslike findings.  Of note, the right superior cerebellar artery trace versus next to the right trigeminal nerve was just beyond the root entry zone. There is no evidence of fracture or aggressive bone lesions.  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  He began gabapentin 100 mg 3 times daily, eventually increased to current dose of 300 mg 3 times daily.  There is some relief, currently described as "2-3 in quality ".  The symptoms have not resolved.  He is otherwise doing well, without any neurological complaints.   Recommendations  Continue gabapentin 300 mg 3 times daily  Follow-up in 2 months  Subjective:    Patient returns today in follow-up.  He reports that his symptoms although improved, have not yet resolved.  He was taking gabapentin 100 mg 3 times daily which helped relieve some of his symptoms.  He reports that he had sensitivity breaking his disease.  (2-3 in quality), but not this morning, A-fib places depression in particular orientation and vibration.  Sometimes, when he touches the area right near his nose, he may experience some sensitivity.  Rubbing the mustache may cause it.  No sinus issues are reported.   Otherwise, he denies any new complaints.  The patient is clearly alert,  oriented, and does not demonstrate any memory deficiencies, he is very eloquent and able to demonstrate adequate memory if not excellent memory.     Initial visit 02/06/2022 this is a very pleasant 76 year old man with a history of hypertension, hyperlipidemia, CAD status post stent placement, seen today for evaluation of R facial and maxillary pain. In review, in December, the patient was seen by a dentist due to tooth sensitivity, requiring dental work. At the time, he also reported sensitivity in the right eyebrow in a vertical line down to the R side of the nose and R nasolabial fold.   He tried topical therapy that did not help.  Dentist suggested that they might be sinuses due to light pressure on the right maxillary area causing tenderness. Denies numbness or tingling, being able to reproduce the pain, "at one point, I felt the pain was coming from the neck". The R eyebrow pain is now "coming and going, about a 2 or 3". Maxillary CT was negative for abnormal findings. He tried Flonase and chlorpheniramine for 6 weeks, only improving to 50 percent, but at times is severe. He denies any change in the sense of smell,  cold intolerance, head injury, pain with chewing. Initially the pain was extending to the right temple but this subsided. 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 No recent infection but his wife had shingles 1 months ago.  He was seen by ophthalmology as well, as 50 years ago he had eye trauma  s/ scleral buckle becoming legally blind , but ophthalmic source was ruled out.        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  Normal  marrow signal, dominant left vertebral artery, other major intracranial flow voids are preserved.  MRI of the face with trigeminal view with and  without contrast 02/23/2022 negative for mass or inflammation, right superior cerebellar artery transverses in close proximity to the right trigeminal nerve near the root entry zone.  PAST MEDICAL HISTORY: Past Medical History:  Diagnosis Date   Legally blind in right eye, as defined in [place]     PAST SURGICAL HISTORY: Past Surgical History:  Procedure Laterality Date   CORONARY STENT INTERVENTION N/A 09/10/2021   Procedure: CORONARY STENT INTERVENTION;  Surgeon: Marykay Lex, MD;  Location: [facility] INVASIVE CV LAB;  Service: Cardiovascular;  Laterality: N/A;   LEFT HEART CATH AND CORONARY ANGIOGRAPHY N/A 09/10/2021   Procedure: LEFT HEART CATH AND CORONARY ANGIOGRAPHY;  Surgeon: Marykay Lex, MD;  Location: [facility] INVASIVE CV LAB;  Service: Cardiovascular;  Laterality: N/A;    MEDICATIONS: Current Outpatient Medications on File Prior to Visit  Medication Sig Dispense Refill   aspirin EC 81 MG tablet Take 81 mg by mouth daily. Swallow whole.     clopidogrel (PLAVIX) 75 MG tablet Take 75 mg by mouth daily.     gabapentin (NEURONTIN) 300 MG capsule Take 1 capsule (300 mg total) by mouth 3 (three) times daily. 90 capsule 3   metoprolol succinate (TOPROL-XL) 50 MG 24 hr tablet Take 1 tablet (50 mg total) by mouth daily. 90 tablet 1   nitroGLYCERIN (NITROSTAT) 0.4 MG SL tablet Place 1 tablet (0.4 mg total) under the tongue every 5 (five) minutes as needed for chest pain. (Patient not taking: Reported on 03/12/2022) 25 tablet 0   rosuvastatin (CRESTOR) 40 MG tablet Take 1 tablet (40 mg total) by mouth daily. 90 tablet 3   No current facility-administered medications on file prior to visit.    ALLERGIES: Allergies  Allergen Reactions   Fluothane [Halothane] Nausea And Vomiting   Oxycodone Other (See Comments)    confusion   Oxycodone Hcl Other (See Comments)    FAMILY HISTORY: No family history on file.    Objective:    General: NAD, well-groomed  Head: Normocephalic  Eyes: Right eye  legally blind after blunt trauma to the eye  Neck: supple, no paraspinal tenderness, full range of motion Back: No paraspinal tenderness Neurological Exam: Mental status: Alert and oriented to person, place and time, recent and remote memory intact, follows knowledge intact, attention and concentration intact, speech fluent and not dysarthric, language intact.   Cranial nerves: CN I: not tested CN II: L pupils equal, round and reactive to light, visual fields intact. R lens opaque.  CN III, IV, VI: Full ROM, no nystagmus, no proptosis.  He has decreased vision on the right (legally blind). CN V: Facial sensation intact , no TTP on the R eyebrow, mild sensation in the right nasolabial area at the V2 region of the nerve, without pain, and at this moment no pain with chewing or swallowing.   CN VII: upper and lower face symmetric CN VIII: Hearing is now intact CN IX, X: Intact gag, uvula midline CN XI: Sternocleidomastoid and trapezius muscles intact CN XII: Tongue midline.  Area inside the mouth, very mild discomfort  on the right. Bulk & Tone: Normal, no fasciculations  motor: Muscle strength 5 out of 5 throughout  sensation: Pinprick, temperature and vibratory sensation intact. Deep Tendon Reflexes: 2+ throughout, toes downgoing.  Finger to nose testing: Without dysmetria Heel to shin: Without dysmetria.   Gait: Normal station and stride.    Thank you for allowing me to take part in the care of this patient.   Marlowe Kays, PA-C    Time spent: 30 minutes.

## 2022-04-21 ENCOUNTER — Other Ambulatory Visit: Payer: Self-pay

## 2022-04-21 NOTE — Patient Outreach (Signed)
[facility]) Care Management  04/21/2022  Rashee Wampole December 28, 1945 [id number]   Telephone call to patient for nurse call.  Patient states this is not a good time.  Patient agreeable for call another time.  Plan: RN CM will attempt again on 04/22/22.  Bary Leriche, RN, MSN [facility] Care Management Care Management Coordinator Direct Line [phone] Toll Free: [phone]  Fax: [phone]

## 2022-04-22 ENCOUNTER — Other Ambulatory Visit: Payer: Self-pay

## 2022-04-22 NOTE — Patient Outreach (Signed)
[facility]) Care Management  04/22/2022  Carlos Suarez 27-Aug-1946 [id number]   Telephone call to patient for nurse call. Patient reports he is doing well. Patient thinks he has already had his AWV with mini mental exam this year.  He is independent and lives with spouse.    Discussed [facility] services. Patient declines need for further follow up at this time.   Plan: RN CM will close case.  Bary Leriche, RN, MSN [facility] Care Management Care Management Coordinator Direct Line [phone] Toll Free: [phone]  Fax: [phone]

## 2022-06-08 ENCOUNTER — Ambulatory Visit: Payer: Medicare PPO | Admitting: Physician Assistant

## 2022-06-19 ENCOUNTER — Encounter: Payer: Self-pay | Admitting: Physician Assistant

## 2022-06-19 ENCOUNTER — Ambulatory Visit: Payer: Medicare PPO | Admitting: Physician Assistant

## 2022-06-19 VITALS — BP 127/74 | HR 53 | Ht 73.0 in | Wt 252.0 lb

## 2022-06-19 DIAGNOSIS — G500 Trigeminal neuralgia: Secondary | ICD-10-CM | POA: Diagnosis not present

## 2022-06-19 MED ORDER — GABAPENTIN 300 MG PO CAPS: ORAL_CAPSULE | ORAL | 3 refills | Status: DC

## 2022-06-19 NOTE — Progress Notes (Signed)
NEUROLOGY CONSULTATION NOTE  Carlos Suarez MRN: [id number] DOB: July 14, 1946   Referring provider: Deland Pretty, MD  Primary care provider: Deland Pretty, MD   Reason for consult:  facial and maxillary pain   Assessment/Plan:   Right facial pain, maxillary pain likely due to trigeminal neuralgia    This is a delightful 76 yo man with 7 month history of unresolving right facial pain, following a V2 pattern, suspicious for trigeminal neuralgia.  Prior MRI of the brain was essentially unremarkable, with a dominant left vertebral artery as well as noted partial bilateral mastoid opacification.  MRA of the face to further evaluate the trigeminal nerve was negative for enhancement or masslike findings.  Of note, the right superior cerebellar artery trace versus next to the right trigeminal nerve was just beyond the root entry zone. There is no evidence of fracture or aggressive bone lesions.  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  He began gabapentin 100 mg 3 times daily, eventually increased to current dose of 300 mg 3 times daily without complete resolution .  He is otherwise doing well   Recommendations  Increase  gabapentin 600 mg 3 times daily. He plans to increase it as follows: 300/600/300, if not therapeutic at night, will do 300/600/600, and if needed will increase to 600 tid.   Follow-up in 2 months  Subjective:    Patient returns today in follow-up.  He reports that his symptoms although improved, have not yet resolved.  He was taking gabapentin 300 mg 3 times daily  with symptom improvement at the beginning, less frequent episode. 2 or 3 a week, "not lasting long", resolving quickly if opening his mouth. However, last Wednesday he had an episode, 2-3 in quality, and lasting a short time, preceded by a small episode while brushing his teeth.  The symptoms are better in the morning,  worse in the afternoon and evening. He reports some neck discomfort, likely attributed  to musculoskeletal etiology taking ibuprofen with some help   Sometimes, when he touches the area right near his nose, he may experience some sensitivity.  Rubbing the mustache may cause it.  No sinus issues are reported.   Otherwise, he denies any new complaints.  The patient is clearly alert, oriented, and does not demonstrate any memory deficiencies, he is very eloquent and able to demonstrate adequate memory if not excellent memory.     Initial visit 02/06/2022 this is a very pleasant 76 year old man with a history of hypertension, hyperlipidemia, CAD status post stent placement, seen today for evaluation of R facial and maxillary pain. In review, in December, the patient was seen by a dentist due to tooth sensitivity, requiring dental work. At the time, he also reported sensitivity in the right eyebrow in a vertical line down to the R side of the nose and R nasolabial fold.   He tried topical therapy that did not help.  Dentist suggested that they might be sinuses due to light pressure on the right maxillary area causing tenderness. Denies numbness or tingling, being able to reproduce the pain, "at one point, I felt the pain was coming from the neck". The R eyebrow pain is now "coming and going, about a 2 or 3". Maxillary CT was negative for abnormal findings. He tried Flonase and chlorpheniramine for 6 weeks, only improving to 50 percent, but at times is severe. He denies any change in the sense of smell,  cold intolerance, head injury, pain with chewing. Initially the pain was extending  to the right temple but this subsided. 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 No recent infection but his wife had shingles 1 months ago.  He was seen by ophthalmology as well, as 50 years ago he had eye trauma s/ scleral buckle becoming legally blind , but ophthalmic source was ruled out.        MRI of the brain 02/23/2022 negative for acute findings, partial  bilateral mastoid opacification, known bilateral cataract resection with probable right scleral buckle, patient is blind on the right eye.  Normal  marrow signal, dominant left vertebral artery, other major intracranial flow voids are preserved.  MRI of the face with trigeminal view with and without contrast 02/23/2022 negative for mass or inflammation, right superior cerebellar artery transverses in close proximity to the right trigeminal nerve near the root entry zone.  PAST MEDICAL HISTORY: Past Medical History:  Diagnosis Date   Legally blind in right eye, as defined in [place]     PAST SURGICAL HISTORY: Past Surgical History:  Procedure Laterality Date   CORONARY STENT INTERVENTION N/A 09/10/2021   Procedure: CORONARY STENT INTERVENTION;  Surgeon: Marykay Lex, MD;  Location: [facility] INVASIVE CV LAB;  Service: Cardiovascular;  Laterality: N/A;   LEFT HEART CATH AND CORONARY ANGIOGRAPHY N/A 09/10/2021   Procedure: LEFT HEART CATH AND CORONARY ANGIOGRAPHY;  Surgeon: Marykay Lex, MD;  Location: [facility] INVASIVE CV LAB;  Service: Cardiovascular;  Laterality: N/A;    MEDICATIONS: Current Outpatient Medications on File Prior to Visit  Medication Sig Dispense Refill   aspirin EC 81 MG tablet Take 81 mg by mouth daily. Swallow whole.     clopidogrel (PLAVIX) 75 MG tablet Take 75 mg by mouth daily.     gabapentin (NEURONTIN) 300 MG capsule Take 1 capsule (300 mg total) by mouth 3 (three) times daily. 90 capsule 3   metoprolol succinate (TOPROL-XL) 50 MG 24 hr tablet Take 1 tablet (50 mg total) by mouth daily. 90 tablet 1   nitroGLYCERIN (NITROSTAT) 0.4 MG SL tablet Place 1 tablet (0.4 mg total) under the tongue every 5 (five) minutes as needed for chest pain. 25 tablet 0   rosuvastatin (CRESTOR) 40 MG tablet Take 1 tablet (40 mg total) by mouth daily. 90 tablet 3   No current facility-administered medications on file prior to visit.    ALLERGIES: Allergies  Allergen Reactions   Fluothane  [Halothane] Nausea And Vomiting   Oxycodone Other (See Comments)    confusion   Oxycodone Hcl Other (See Comments)    FAMILY HISTORY: No family history on file.    Objective:    General: NAD, well-groomed  Head: Normocephalic  Eyes: Right eye legally blind after blunt trauma to the eye  Neck: supple, no paraspinal tenderness, full range of motion Back: No paraspinal tenderness Neurological Exam: Mental status: Alert and oriented to person, place and time, recent and remote memory intact, follows knowledge intact, attention and concentration intact, speech fluent and not dysarthric, language intact.   Cranial nerves: CN I: not tested CN II: L pupils equal, round and reactive to light, visual fields intact. R lens opaque.  CN III, IV, VI: Full ROM, no nystagmus, no proptosis.  He has decreased vision on the right (legally blind). CN V: Facial sensation intact , no TTP on the R eyebrow, mild sensation in the right nasolabial area at the V2 region of the nerve, without pain, and at this  moment no pain with chewing or swallowing.   CN VII: upper and lower face symmetric CN VIII: Hearing is now intact CN IX, X: Intact gag, uvula midline CN XI: Sternocleidomastoid and trapezius muscles intact CN XII: Tongue midline.  Area inside the mouth, very mild discomfort on the right. Bulk & Tone: Normal, no fasciculations  motor: Muscle strength 5 out of 5 throughout  sensation: Pinprick, temperature and vibratory sensation intact. Deep Tendon Reflexes: 2+ throughout, toes downgoing.  Finger to nose testing: Without dysmetria Heel to shin: Without dysmetria.   Gait: Normal station and stride.    Thank you for allowing me to take part in the care of this patient.   Marlowe Kays, PA-C    Time spent: 30 minutes.

## 2022-06-19 NOTE — Patient Instructions (Addendum)
  Continue gabapentin  600 mg 3 times a day  Follow up  in Dec 20  at 1 pm

## 2022-06-29 ENCOUNTER — Telehealth: Payer: Self-pay | Admitting: Physician Assistant

## 2022-06-29 NOTE — Telephone Encounter (Signed)
Patient is taking 4, 300 mg capsules of gabapentin daily and is considering increasing to 5, 300 mg capsules daily.  He'd like Kennis Carina okay before he starts doing this.

## 2022-07-31 ENCOUNTER — Other Ambulatory Visit: Payer: Self-pay | Admitting: Cardiovascular Disease

## 2022-08-19 ENCOUNTER — Encounter: Payer: Self-pay | Admitting: Physician Assistant

## 2022-08-19 ENCOUNTER — Ambulatory Visit: Payer: Medicare PPO | Admitting: Physician Assistant

## 2022-08-19 VITALS — BP 149/92 | HR 60 | Resp 15 | Ht 73.0 in | Wt 256.0 lb

## 2022-08-19 DIAGNOSIS — G500 Trigeminal neuralgia: Secondary | ICD-10-CM | POA: Diagnosis not present

## 2022-08-19 MED ORDER — GABAPENTIN 300 MG PO CAPS: ORAL_CAPSULE | ORAL | 3 refills | Status: DC

## 2022-08-19 NOTE — Progress Notes (Signed)
NEUROLOGY CONSULTATION NOTE  Carlos Suarez MRN: [id number] DOB: 1946/02/05   Referring provider: Deland Pretty, MD  Primary care provider: Deland Pretty, MD   Reason for consult:  facial and maxillary pain   Assessment/Plan:   Right facial pain, maxillary pain likely due to trigeminal neuralgia    This is a delightful 76 yo man with 7 month history of unresolving right facial pain, following a V2 pattern, suspicious for trigeminal neuralgia.  Prior MRI of the brain was essentially unremarkable, with a dominant left vertebral artery as well as noted partial bilateral mastoid opacification.  MRA of the face to further evaluate the trigeminal nerve was negative for enhancement or masslike findings.  Of note, the right superior cerebellar artery trace versus next to the right trigeminal nerve was just beyond the root entry zone. There is no evidence of fracture or aggressive bone lesions.  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  He began gabapentin 100 mg 3 times daily, eventually increased the doses without complete resolution.  We discussed utilizing other agents, such as Tegretol, initially with weaning down of gabapentin teen and then overlapping with the lower dose of Tegretol with close sodium monitoring.  Patient prefers to try increasing gabapentin dose again, and monitor for improvement of the symptoms.  If not working, he will entertain changing the agent.  His discomfort is worse in the afternoon and evening.  He is otherwise doing well.  Recommendations  Increase  gabapentin 600 mg in the morning, 900 mg in the afternoon and evening, may increase to 900 mg 3 times daily if needed, prescription sent to pharmacy. Follow-up in 2 months  Subjective:    Patient returns today in follow-up.  He reports that his symptoms although improved, have not yet resolved.  He was taking gabapentin 600 mg 3 times daily 3 times daily for the last 3 months, reporting the quality of the  discomfort for being about a 2 or 3.  He reports that when brushing his teeth, he feels "several shocks "resolving quickly if opening his mouth.  "If bending over to pick, this may trigger feeling of rash, like exposed skin of the surface wound ". The symptoms are better in the morning,  worse in the afternoon and evening. He reports some neck discomfort, likely attributed to musculoskeletal etiology taking ibuprofen with some help   Sometimes, when he touches the area right near his nose, he may experience some sensitivity.  Rubbing the mustache may cause it.  No sinus issues are reported.   Otherwise, he denies any new complaints.  The patient is clearly alert, oriented, and does not demonstrate any memory deficiencies, he is very eloquent and able to demonstrate adequate memory if not excellent memory.    Initial visit 02/06/2022 this is a very pleasant 76 year old man with a history of hypertension, hyperlipidemia, CAD status post stent placement, seen today for evaluation of R facial and maxillary pain. In review, in December, the patient was seen by a dentist due to tooth sensitivity, requiring dental work. At the time, he also reported sensitivity in the right eyebrow in a vertical line down to the R side of the nose and R nasolabial fold.   He tried topical therapy that did not help.  Dentist suggested that they might be sinuses due to light pressure on the right maxillary area causing tenderness. Denies numbness or tingling, being able to reproduce the pain, "at one point, I felt the pain was coming from the neck". The  R eyebrow pain is now "coming and going, about a 2 or 3". Maxillary CT was negative for abnormal findings. He tried Flonase and chlorpheniramine for 6 weeks, only improving to 50 percent, but at times is severe. He denies any change in the sense of smell,  cold intolerance, head injury, pain with chewing. Initially the pain was extending to the right temple but this subsided. 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 No recent infection but his wife had shingles 1 months ago.  He was seen by ophthalmology as well, as 50 years ago he had eye trauma s/ scleral buckle becoming legally blind , but ophthalmic source was ruled out.        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  Normal  marrow signal, dominant left vertebral artery, other major intracranial flow voids are preserved.  MRI of the face with trigeminal view with and without contrast 02/23/2022 negative for mass or inflammation, right superior cerebellar artery transverses in close proximity to the right trigeminal nerve near the root entry zone.  PAST MEDICAL HISTORY: Past Medical History:  Diagnosis Date   Legally blind in right eye, as defined in [place]     PAST SURGICAL HISTORY: Past Surgical History:  Procedure Laterality Date   CORONARY STENT INTERVENTION N/A 09/10/2021   Procedure: CORONARY STENT INTERVENTION;  Surgeon: Marykay Lex, MD;  Location: [facility] INVASIVE CV LAB;  Service: Cardiovascular;  Laterality: N/A;   LEFT HEART CATH AND CORONARY ANGIOGRAPHY N/A 09/10/2021   Procedure: LEFT HEART CATH AND CORONARY ANGIOGRAPHY;  Surgeon: Marykay Lex, MD;  Location: [facility] INVASIVE CV LAB;  Service: Cardiovascular;  Laterality: N/A;    MEDICATIONS: Current Outpatient Medications on File Prior to Visit  Medication Sig Dispense Refill   aspirin EC 81 MG tablet Take 81 mg by mouth daily. Swallow whole.     clopidogrel (PLAVIX) 75 MG tablet Take 75 mg by mouth daily.     gabapentin (NEURONTIN) 300 MG capsule Take  1 tab in the morning,  2 tab in the afternoon and 1 tab at night ; may increase to 2 tabs 3 times a day if needed 180 capsule 3   ibuprofen (ADVIL) 800 MG tablet Take 800 mg by mouth every 8  (eight) hours as needed.     metoprolol succinate (TOPROL-XL) 50 MG 24 hr tablet TAKE 1 TABLET BY MOUTH EVERY DAY 90 tablet 3   nitroGLYCERIN (NITROSTAT) 0.4 MG SL tablet Place 1 tablet (0.4 mg total) under the tongue every 5 (five) minutes as needed for chest pain. 25 tablet 0   rosuvastatin (CRESTOR) 40 MG tablet Take 1 tablet (40 mg total) by mouth daily. 90 tablet 3   No current facility-administered medications on file prior to visit.    ALLERGIES: Allergies  Allergen Reactions   Fluothane [Halothane] Nausea And Vomiting   Oxycodone Other (See Comments)    confusion   Oxycodone Hcl Other (See Comments)    FAMILY HISTORY: No family history on file.    Objective:    General: NAD, well-groomed  Head: Normocephalic  Eyes: Right eye legally blind after blunt trauma to the eye  Neck: supple, no paraspinal tenderness, full range of motion Back: No paraspinal tenderness Neurological Exam: Mental status: Alert and oriented to person, place and  time, recent and remote memory intact, follows knowledge intact, attention and concentration intact, speech fluent and not dysarthric, language intact.   Cranial nerves: CN I: not tested CN II: L pupils equal, round and reactive to light, visual fields intact.  Right lens opaque CN III, IV, VI: Full ROM, no nystagmus, no proptosis.  He has decreased vision on the right (legally blind). CN V: Facial sensation intact no tenderness to palpation on the right eyebrow today, there is mild sensation in the right nasolabial area at the V2 region of the nerve, without pain, and at this moment no pain with chewing or swallowing.  CN VII: upper and lower face symmetric CN VIII: Hearing is now intact CN IX, X: Intact gag, uvula midline CN XI: Sternocleidomastoid and trapezius muscles intact CN XII: Tongue midline.  Area inside the mouth with very mild discomfort on the right.   Bulk & Tone: Normal, no fasciculations  motor: Muscle strength 5 out of 5  throughout  sensation: Pinprick, temperature and vibratory sensation intact. Deep Tendon Reflexes: 2+ throughout, toes downgoing.  Finger to nose testing: Without dysmetria Heel to shin: Without dysmetria.   Gait: Normal station and stride.    Thank you for allowing me to take part in the care of this patient.   Sharene Butters, PA-C    Time spent: 38 minutes.

## 2022-08-19 NOTE — Patient Instructions (Signed)
  Continue gabapentin  600 mg  in am, then 900 mg afternoon and evening. May increase to 900 mg 3 times a day  Follow up  in  2 months

## 2022-09-16 ENCOUNTER — Telehealth: Payer: Self-pay | Admitting: Anesthesiology

## 2022-09-16 NOTE — Telephone Encounter (Signed)
Pt called stating he would like to inform Carlos Suarez that he increased his Neurontin from 2400 mg to 2700 mg per day, 3 tabs tid has been taking for 10 days now still has not noticed any relief with his trigeminal neuralgia.

## 2022-09-23 DIAGNOSIS — H269 Unspecified cataract: Secondary | ICD-10-CM | POA: Diagnosis not present

## 2022-09-23 DIAGNOSIS — I10 Essential (primary) hypertension: Secondary | ICD-10-CM | POA: Diagnosis not present

## 2022-09-23 DIAGNOSIS — Z809 Family history of malignant neoplasm, unspecified: Secondary | ICD-10-CM | POA: Diagnosis not present

## 2022-09-23 DIAGNOSIS — Z8249 Family history of ischemic heart disease and other diseases of the circulatory system: Secondary | ICD-10-CM | POA: Diagnosis not present

## 2022-09-23 DIAGNOSIS — I739 Peripheral vascular disease, unspecified: Secondary | ICD-10-CM | POA: Diagnosis not present

## 2022-09-23 DIAGNOSIS — I2510 Atherosclerotic heart disease of native coronary artery without angina pectoris: Secondary | ICD-10-CM | POA: Diagnosis not present

## 2022-09-23 DIAGNOSIS — E785 Hyperlipidemia, unspecified: Secondary | ICD-10-CM | POA: Diagnosis not present

## 2022-09-23 DIAGNOSIS — E669 Obesity, unspecified: Secondary | ICD-10-CM | POA: Diagnosis not present

## 2022-09-23 DIAGNOSIS — G500 Trigeminal neuralgia: Secondary | ICD-10-CM | POA: Diagnosis not present

## 2022-10-02 DIAGNOSIS — D1801 Hemangioma of skin and subcutaneous tissue: Secondary | ICD-10-CM | POA: Diagnosis not present

## 2022-10-02 DIAGNOSIS — L218 Other seborrheic dermatitis: Secondary | ICD-10-CM | POA: Diagnosis not present

## 2022-10-02 DIAGNOSIS — L821 Other seborrheic keratosis: Secondary | ICD-10-CM | POA: Diagnosis not present

## 2022-10-02 DIAGNOSIS — L820 Inflamed seborrheic keratosis: Secondary | ICD-10-CM | POA: Diagnosis not present

## 2022-10-02 DIAGNOSIS — L918 Other hypertrophic disorders of the skin: Secondary | ICD-10-CM | POA: Diagnosis not present

## 2022-10-02 DIAGNOSIS — L720 Epidermal cyst: Secondary | ICD-10-CM | POA: Diagnosis not present

## 2022-10-20 ENCOUNTER — Encounter: Payer: Self-pay | Admitting: Physician Assistant

## 2022-10-20 ENCOUNTER — Ambulatory Visit: Payer: Medicare PPO | Admitting: Physician Assistant

## 2022-10-20 VITALS — BP 136/77 | HR 76 | Resp 18 | Ht 73.0 in | Wt 257.0 lb

## 2022-10-20 DIAGNOSIS — G500 Trigeminal neuralgia: Secondary | ICD-10-CM

## 2022-10-20 MED ORDER — GABAPENTIN 300 MG PO CAPS: ORAL_CAPSULE | ORAL | 3 refills | Status: DC

## 2022-10-20 NOTE — Patient Instructions (Signed)
  Continue gabapentin  900 mg 3 times a day  Follow up  in  2 months

## 2022-10-20 NOTE — Progress Notes (Signed)
NEUROLOGY CONSULTATION NOTE  Davieon Morefield MRN: [id number] DOB: 07-Sep-1945   Referring provider: Deland Pretty, MD  Primary care provider: Deland Pretty, MD   Reason for consult:  facial and maxillary pain   Assessment/Plan:   Right facial pain, maxillary pain likely due to trigeminal neuralgia    This is a delightful 77 yo man with 7 month history of unresolving right facial pain, following a V2 pattern, suspicious for trigeminal neuralgia.  Prior MRI of the brain was essentially unremarkable, with a dominant left vertebral artery as well as noted partial bilateral mastoid opacification.  MRA of the face to further evaluate the trigeminal nerve was negative for enhancement or masslike findings.  Of note, the right superior cerebellar artery trace versus next to the right trigeminal nerve was just beyond the root entry zone. There is no evidence of fracture or aggressive bone lesions.  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  He began gabapentin 100 mg 3 times daily, eventually increased the doses. He is currently on Gabapentin 900 mg tid and as of today,  he reports being pain free for the first time.  We discussed utilizing other agents, such as Tegretol or Cymbalta, initially with weaning down of gabapentin as another option, versus at one point decreasing gabapentin altogether, although we are to wait a few weeks to change any therapy.. For now, he prefers to stay on the same dose.  His discomfort is worse in the afternoon and evening, is otherwise doing well.  Recommendations  Continue gabapentin 900 mg 3 times daily if needed, prescription sent to pharmacy. Follow-up in 2 months  Subjective:    Patient returns today in follow-up.  He reports that his symptoms were worse 2-3 weeks after the last appt, and then remained "as usual", but today, the symptoms were not present at all. He was touching his face, chewing , moving the jaw and he was not feeling the pain   He is   taking gabapentin 900 mg 3 times daily 3 times daily  He reports some chronic neck discomfort, likely attributed to musculoskeletal etiology taking ibuprofen with some help.  Otherwise, he denies any new complaints.  The patient is clearly alert, oriented, and does not demonstrate any memory deficiencies, he is very eloquent and able to demonstrate adequate, if not excellent memory.        Initial visit 02/06/2022 this is a very pleasant 77 year old man with a history of hypertension, hyperlipidemia, CAD status post stent placement, seen today for evaluation of R facial and maxillary pain. In review, in December, the patient was seen by a dentist due to tooth sensitivity, requiring dental work. At the time, he also reported sensitivity in the right eyebrow in a vertical line down to the R side of the nose and R nasolabial fold.   He tried topical therapy that did not help.  Dentist suggested that they might be sinuses due to light pressure on the right maxillary area causing tenderness. Denies numbness or tingling, being able to reproduce the pain, "at one point, I felt the pain was coming from the neck". The R eyebrow pain is now "coming and going, about a 2 or 3". Maxillary CT was negative for abnormal findings. He tried Flonase and chlorpheniramine for 6 weeks, only improving to 50 percent, but at times is severe. He denies any change in the sense of smell,  cold intolerance, head injury, pain with chewing. Initially the pain was extending to the right temple but this  subsided. 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 No recent infection but his wife had shingles 1 months ago.  He was seen by ophthalmology as well, as 50 years ago he had eye trauma s/ scleral buckle becoming legally blind , but ophthalmic source was ruled out.        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  Normal  marrow signal, dominant left vertebral artery, other major intracranial flow voids are preserved.  MRI of the face with trigeminal view with and without contrast 02/23/2022 negative for mass or inflammation, right superior cerebellar artery transverses in close proximity to the right trigeminal nerve near the root entry zone.  PAST MEDICAL HISTORY: Past Medical History:  Diagnosis Date   Legally blind in right eye, as defined in [place]     PAST SURGICAL HISTORY: Past Surgical History:  Procedure Laterality Date   CORONARY STENT INTERVENTION N/A 09/10/2021   Procedure: CORONARY STENT INTERVENTION;  Surgeon: Leonie Man, MD;  Location: [facility];  Service: Cardiovascular;  Laterality: N/A;   LEFT HEART CATH AND CORONARY ANGIOGRAPHY N/A 09/10/2021   Procedure: LEFT HEART CATH AND CORONARY ANGIOGRAPHY;  Surgeon: Leonie Man, MD;  Location: [facility];  Service: Cardiovascular;  Laterality: N/A;    MEDICATIONS: Current Outpatient Medications on File Prior to Visit  Medication Sig Dispense Refill   aspirin EC 81 MG tablet Take 81 mg by mouth daily. Swallow whole.     gabapentin (NEURONTIN) 300 MG capsule Take  2 tab in the morning,  3 tab in the afternoon and 3 tab at night ; may increase to 3  tabs 3 times a day if needed 270 capsule 3   metoprolol succinate (TOPROL-XL) 50 MG 24 hr tablet TAKE 1 TABLET BY MOUTH EVERY DAY 90 tablet 3   nitroGLYCERIN (NITROSTAT) 0.4 MG SL tablet Place 1 tablet (0.4 mg total) under the tongue every 5 (five) minutes as needed for chest pain. 25 tablet 0   rosuvastatin (CRESTOR) 40 MG tablet Take 1 tablet (40 mg total) by mouth daily. 90 tablet 3   No current facility-administered medications on file prior to visit.    ALLERGIES: Allergies  Allergen Reactions   Fluothane [Halothane] Nausea And Vomiting   Oxycodone Other (See Comments)     confusion   Oxycodone Hcl Other (See Comments)    FAMILY HISTORY: History reviewed. No pertinent family history.    Objective:    General: NAD, well-groomed  Head: Normocephalic  Eyes: Right eye legally blind after blunt trauma to the eye  Neck: supple, no paraspinal tenderness, full range of motion Back: No paraspinal tenderness Neurological Exam: Mental status: Alert and oriented to person, place and time, recent and remote memory intact, follows knowledge intact, attention and concentration intact, speech fluent and not dysarthric, language intact.   Cranial nerves: CN I: not tested CN II: L pupils equal, round and reactive to light, visual fields intact.  Right lens opaque CN III, IV, VI: Full ROM, no nystagmus, no proptosis.  He has decreased vision on the right (legally blind). CN V: Facial sensation intact no tenderness to palpation on the right eyebrow today, there is no painful sensation in the right nasolabial area at the V2 region of the nerve as before. CN  VII: upper and lower face symmetric CN VIII: Hearing is now intact CN IX, X: Intact gag, uvula midline CN XI: Sternocleidomastoid and trapezius muscles intact CN XII: Tongue midline.  Area inside the mouth with very mild discomfort on the right.   Bulk & Tone: Normal, no fasciculations  motor: Muscle strength 5 out of 5 throughout  sensation: Pinprick, temperature and vibratory sensation intact. Deep Tendon Reflexes: 2+ throughout, toes downgoing.  Finger to nose testing: Without dysmetria Heel to shin: Without dysmetria.   Gait: Normal station and stride.    Thank you for allowing me to take part in the care of this patient.   Sharene Butters, PA-C    Time spent: 38 minutes.

## 2022-11-09 ENCOUNTER — Other Ambulatory Visit: Payer: Self-pay | Admitting: Cardiology

## 2022-12-08 DIAGNOSIS — H26492 Other secondary cataract, left eye: Secondary | ICD-10-CM | POA: Diagnosis not present

## 2022-12-08 DIAGNOSIS — H43812 Vitreous degeneration, left eye: Secondary | ICD-10-CM | POA: Diagnosis not present

## 2022-12-08 DIAGNOSIS — Z961 Presence of intraocular lens: Secondary | ICD-10-CM | POA: Diagnosis not present

## 2022-12-08 DIAGNOSIS — H524 Presbyopia: Secondary | ICD-10-CM | POA: Diagnosis not present

## 2022-12-23 IMAGING — CT CT MAXILLOFACIAL W/O CM
3 of 5 series · 15 of 47 positions shown, 18 images · non-contrast
Comparison: None Available.

CLINICAL DATA: 75-year-old male with right maxillary pain. Pain
from the top of the mouth to the temple. Burning sensation in right
eye since [REDACTED]. No known injury.



[Series 4: sinus 2.00 hr60 s3 cor bone · coronal · 0.36mm/px · 3 of 100 slices shown]
[im 34/100  bone]
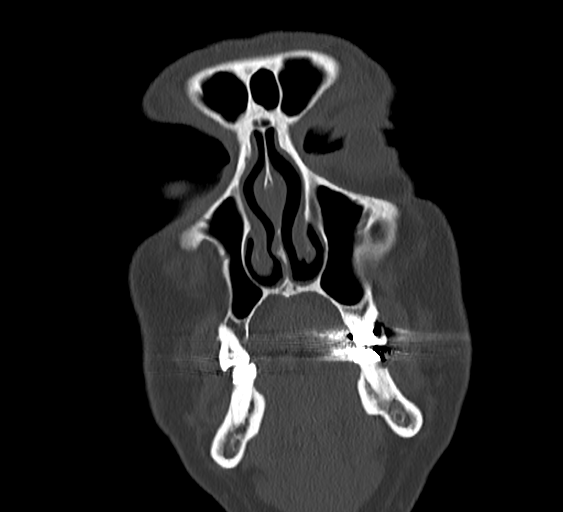
[im 45/100  bone]
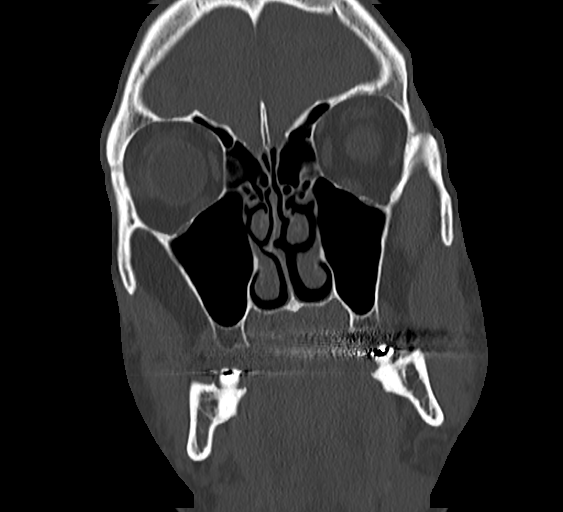
[im 56/100  bone]
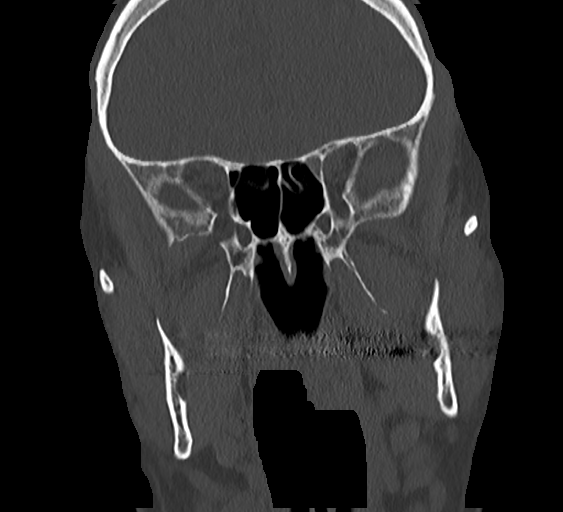

[Series 6: sinus 2.00 hr60 s3 sag bone · sagittal · 0.36mm/px · 3 of 100 slices shown]
[im 34/100  bone]
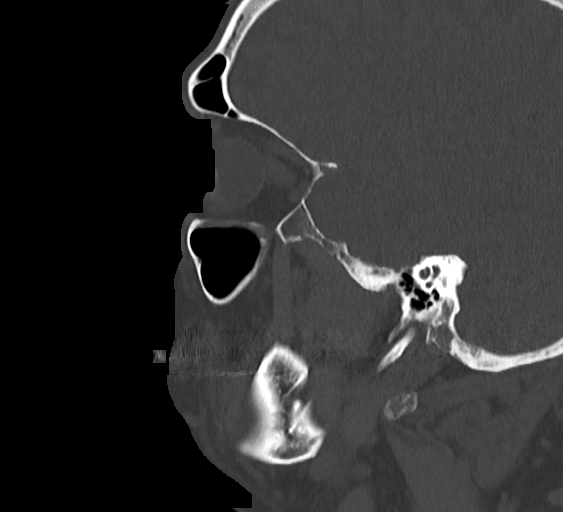
[im 50/100  bone]
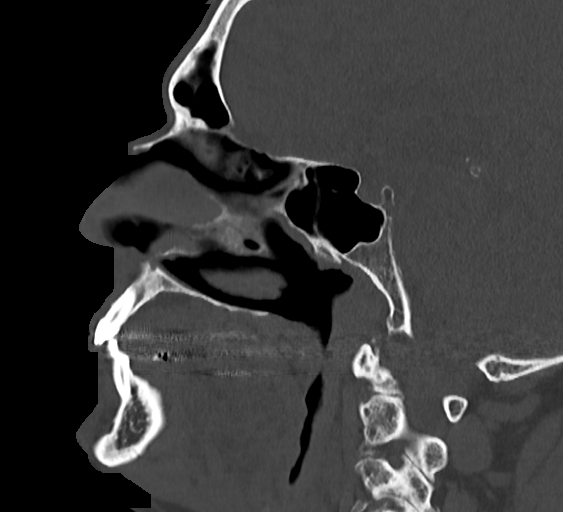
[im 67/100  bone]
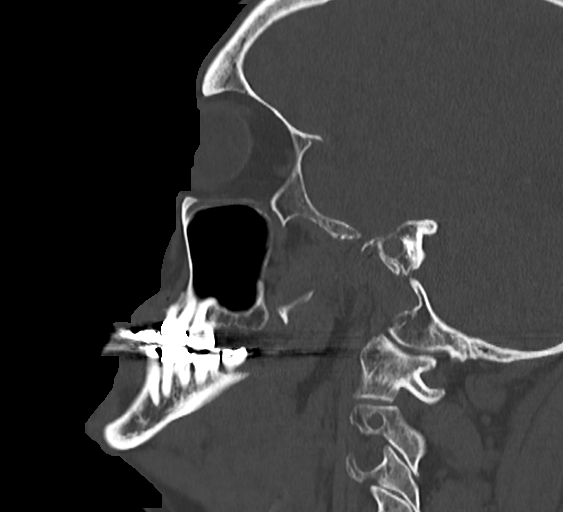

[Series 10: sinus 0.60 hr60 s3 axial fusion thins · axial · 0.39mm/px · z∈[-699,-536]mm · 9 of 305 slices shown, 12 images]
[im 17/305  brain]
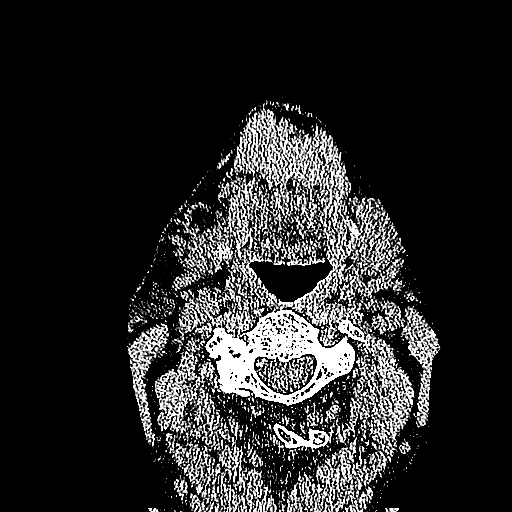
[im 17/305  bone]
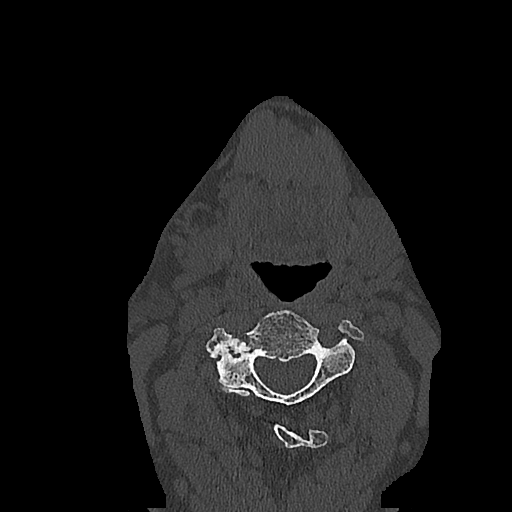
[im 51/305  bone]
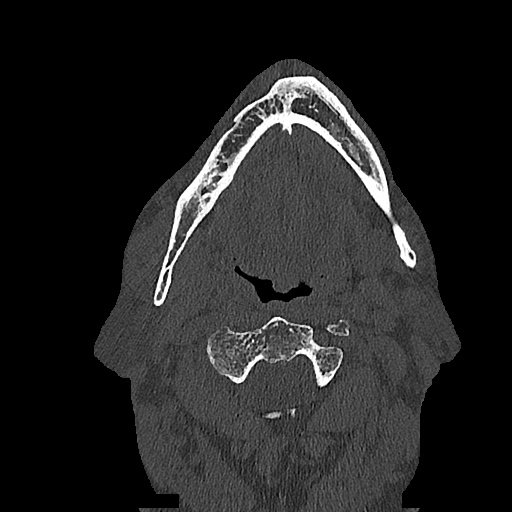
[im 85/305  bone]
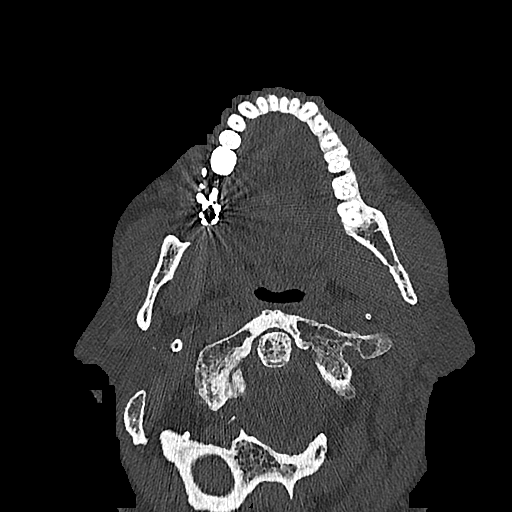
[im 119/305  bone]
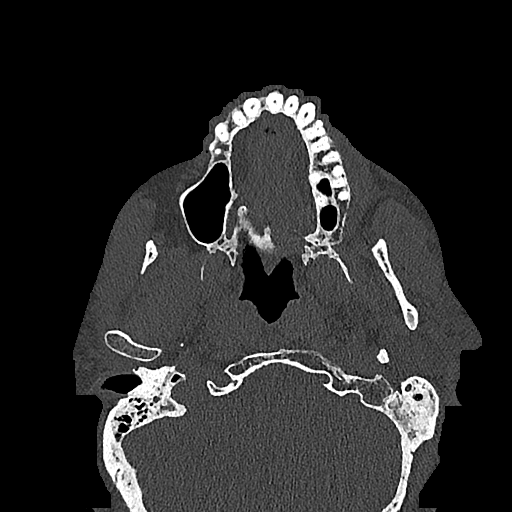
[im 153/305  brain]
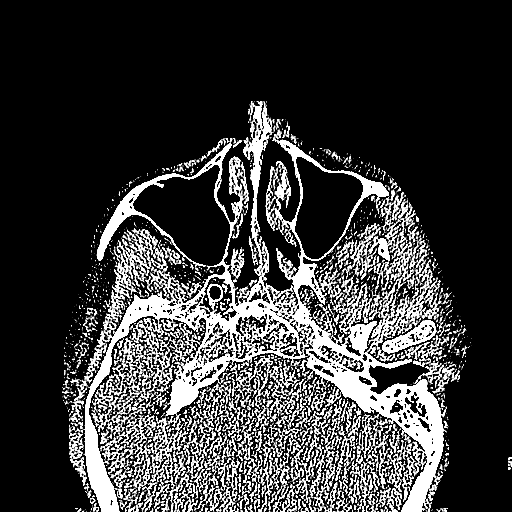
[im 153/305  bone]
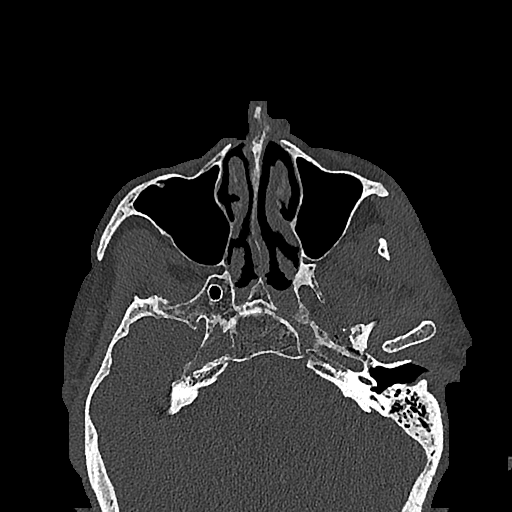
[im 186/305  bone]
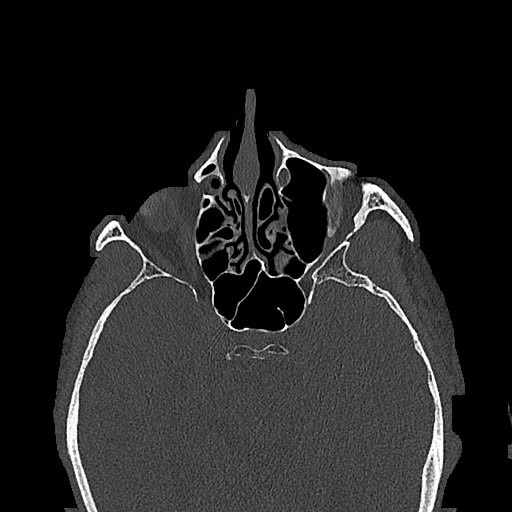
[im 220/305  bone]
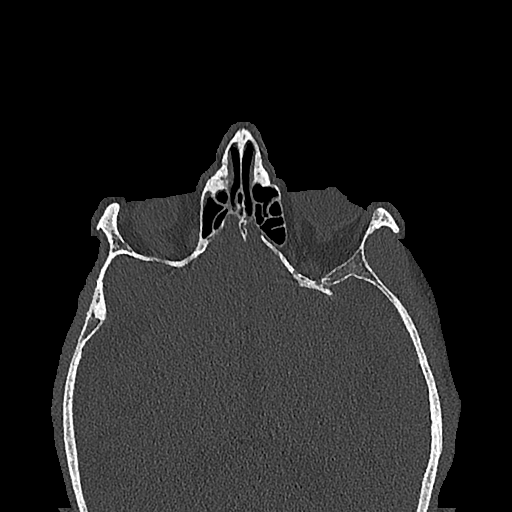
[im 254/305  bone]
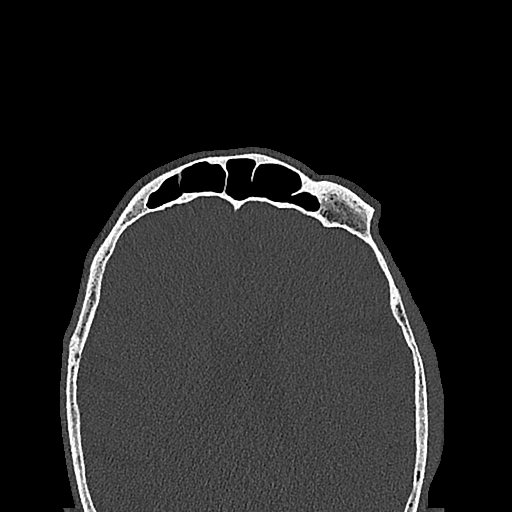
[im 288/305  brain]
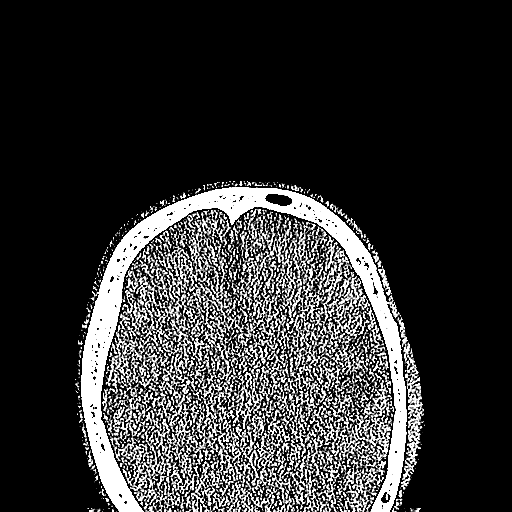
[im 288/305  bone]
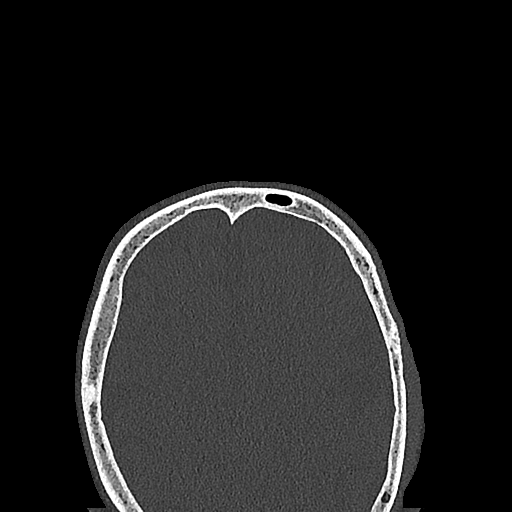

[15 of 47 positions shown; findings below may reference images not displayed]

FINDINGS: Osseous: Mandible is intact and normally located. No acute dental
finding identified.

Bilateral maxilla, zygoma, pterygoid, and nasal bones appear intact.
Facial bone mineralization appears symmetric and within normal
limits.

Intact central skull base. Partially visible cervical spine
degeneration greater on the right, including moderate to severe
facet arthropathy at C2-C3.

Visible calvarium intact, unremarkable.

Orbits: Intact orbital walls. Postoperative changes to both globes,
more extensive on the right. Intraorbital soft tissues otherwise are
within normal limits.

Sinuses: Clear throughout. Tympanic cavities and mastoids are well
aerated. Mild rightward nasal septal deviation. Nasal cavity well
aerated.

Soft tissues: Negative visible noncontrast pharynx, parapharyngeal
spaces, retropharyngeal space, sublingual space, submandibular
spaces, and masticator spaces. Negative bilateral parotid spaces
aside from punctate sialolithiasis. No soft tissue swelling or
inflammation identified. No upper cervical lymphadenopathy.

Limited intracranial: Calcified atherosclerosis at the skull base.
Negative for age visible noncontrast brain parenchyma.
IMPRESSION: 1. Unremarkable noncontrast CT appearance of the Face. No paranasal
sinus disease. No evidence of acute dental disease. No explanation
for pain.

2. Partially visible cervical spine degeneration.

## 2022-12-25 ENCOUNTER — Ambulatory Visit: Payer: Medicare PPO | Admitting: Physician Assistant

## 2022-12-25 ENCOUNTER — Encounter: Payer: Self-pay | Admitting: Physician Assistant

## 2022-12-25 VITALS — BP 126/77 | HR 67 | Ht 73.0 in | Wt 258.0 lb

## 2022-12-25 DIAGNOSIS — G500 Trigeminal neuralgia: Secondary | ICD-10-CM

## 2022-12-25 MED ORDER — CARBAMAZEPINE 200 MG PO TABS: ORAL_TABLET | ORAL | 11 refills | Status: DC

## 2022-12-25 MED ORDER — GABAPENTIN 300 MG PO CAPS: ORAL_CAPSULE | ORAL | 3 refills | Status: DC

## 2022-12-25 NOTE — Patient Instructions (Addendum)
  Continue gabapentin  900 mg 3 times a day  Start Tegretol 200 mg at night for 1 weeks and then increase to twice a day  Follow up  in  2 months

## 2022-12-25 NOTE — Progress Notes (Signed)
NEUROLOGY CONSULTATION NOTE  Carlos Suarez MRN: [id number] DOB: 1945/12/19   Referring provider: [name], MD  Primary care provider: [name], MD   Reason for consult:  facial and maxillary pain   Assessment/Plan:   Right facial pain, maxillary pain likely due to trigeminal neuralgia    This is a delightful 77 yo man with 7 month history of unresolving right facial pain, following a V2 pattern, suspicious for trigeminal neuralgia.  Prior MRI of the brain was essentially unremarkable, with a dominant left vertebral artery as well as noted partial bilateral mastoid opacification.  MRA of the face to further evaluate the trigeminal nerve was negative for enhancement or masslike findings.  Of note, the right superior cerebellar artery trace versus next to the right trigeminal nerve was just beyond the root entry zone. There is no evidence of fracture or aggressive bone lesions.  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  He began treatment with gabapentin 100 mg 3 times daily, eventually increasing the doses. He is currently on Gabapentin 900 mg tid.  During his last visit a couple of months ago, he was "pain free for the first time ".  Unfortunately, the patient missed a couple of doses, which seem to have set his symptoms back a few steps.  We discussed adding another agent to the regimen, such as Tegretol, and it is felt prudent to initiate such therapy for relief.  He has to attend several events, travel, therefore he is interested in it, but he is not to start for another couple of weeks.  He is otherwise doing well, without any new medical diagnoses.  He is able to perform activities of daily living without difficulty.   Recommendations  Continue gabapentin 900 mg 3 times daily. prescription sent to pharmacy.  Side effects priorly discussed Start Tegretol 200 mg at night, for 1 week, then increase to 1 tab twice daily for relief. Follow-up in 2 months  Subjective:     Patient returns today in follow-up.  As recall, the patient was doing well for quite some time, and during his last visit he was happy because he was essentially pain-free.  Unfortunately, he missed 1 dose 4 weeks ago, in the afternoon, "I had to pay for that ", as the symptoms returned.  He did not take the 3 PM dose, by 10:00 he described the symptoms as if the skin was coming off.  The symptoms were worse when touching his mustache, that would elicit the pain.  He also had pain around the nasolabial line, and at the oral mucosa.  He had a second episode similar to the prior one but less severe, when he forgot an afternoon dose last week.  Currently, he reports right facial burning, and he feels that if he bends over, the pain is synchronized with the pulse.  Also, the pain is triggered by conversations, brushing and shaving.  "from the right nose to the right eye socket although one time he felt that it could cross the palate ".  No mental status changes.  He is alert, oriented, and does not demonstrate any memory deficiencies, he is very eloquent and able to demonstrate adequate, if not excellent memory.  He is now compliant this week with the medication, and since he has a travel to an event in the next couple of weeks, he is to consider starting a new agent such as Tegretol, added to the current regimen with gabapentin.  However, if he continues to feel  well only with gabapentin, he may be inclined to delay starting on another medicine.  We discussed all the side effects of Tegretol, and a prescription has been sent if needed.  Initial visit 02/06/2022 this is a very pleasant 77 year old man with a history of hypertension, hyperlipidemia, CAD status post stent placement, seen today for evaluation of R facial and maxillary pain. In review, in December, the patient was seen by a dentist due to tooth sensitivity, requiring dental work. At the time, he also reported sensitivity in the right eyebrow in a  vertical line down to the R side of the nose and R nasolabial fold.   He tried topical therapy that did not help.  Dentist suggested that they might be sinuses due to light pressure on the right maxillary area causing tenderness. Denies numbness or tingling, being able to reproduce the pain, "at one point, I felt the pain was coming from the neck". The R eyebrow pain is now "coming and going, about a 2 or 3". Maxillary CT was negative for abnormal findings. He tried Flonase and chlorpheniramine for 6 weeks, only improving to 50 percent, but at times is severe. He denies any change in the sense of smell,  cold intolerance, head injury, pain with chewing. Initially the pain was extending to the right temple but this subsided. 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 No recent infection but his wife had shingles 1 months ago.  He was seen by ophthalmology as well, as 50 years ago he had eye trauma s/ scleral buckle becoming legally blind , but ophthalmic source was ruled out.        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  Normal  marrow signal, dominant left vertebral artery, other major intracranial flow voids are preserved.  MRI of the face with trigeminal view with and without contrast 02/23/2022 negative for mass or inflammation, right superior cerebellar artery transverses in close proximity to the right trigeminal nerve near the root entry zone.  PAST MEDICAL HISTORY: Past Medical History:  Diagnosis Date   Legally blind in right eye, as defined in [place]     PAST SURGICAL HISTORY: Past Surgical History:  Procedure Laterality Date   CORONARY STENT INTERVENTION N/A 09/10/2021   Procedure: CORONARY STENT INTERVENTION;  Surgeon: Marykay Lex, MD;  Location: [facility];   Service: Cardiovascular;  Laterality: N/A;   LEFT HEART CATH AND CORONARY ANGIOGRAPHY N/A 09/10/2021   Procedure: LEFT HEART CATH AND CORONARY ANGIOGRAPHY;  Surgeon: Marykay Lex, MD;  Location: [facility];  Service: Cardiovascular;  Laterality: N/A;    MEDICATIONS: Current Outpatient Medications on File Prior to Visit  Medication Sig Dispense Refill   aspirin EC 81 MG tablet Take 81 mg by mouth daily. Swallow whole.     gabapentin (NEURONTIN) 300 MG capsule Take  3 tabs  3 times a day 270 capsule 3   metoprolol succinate (TOPROL-XL) 50 MG 24 hr tablet TAKE 1 TABLET BY MOUTH EVERY DAY 90 tablet 3   nitroGLYCERIN (NITROSTAT) 0.4 MG SL tablet Place 1 tablet (0.4 mg total) under the tongue every 5 (five) minutes as needed for chest pain. 25 tablet 0   rosuvastatin (CRESTOR) 40 MG tablet TAKE 1 TABLET BY MOUTH EVERY DAY 90 tablet 2  No current facility-administered medications on file prior to visit.    ALLERGIES: Allergies  Allergen Reactions   Fluothane [Halothane] Nausea And Vomiting   Oxycodone Other (See Comments)    confusion   Oxycodone Hcl Other (See Comments)    FAMILY HISTORY: No family history on file.    Objective:    General: NAD, well-groomed  Head: Normocephalic  Eyes: Right eye legally blind after blunt trauma to the eye  Neck: supple, no paraspinal tenderness, full range of motion Back: No paraspinal tenderness Neurological Exam: Mental status: Alert and oriented to person, place and time, recent and remote memory intact, follows knowledge intact, attention and concentration intact, speech fluent and not dysarthric, language intact.   Cranial nerves: CN I: not tested CN II: L pupils equal, round and reactive to light, visual fields intact.  Right lens opaque CN III, IV, VI: Full ROM, no nystagmus, no proptosis.  He has decreased vision on the right (legally blind). CN V: Facial sensation intact no tenderness to palpation on the right eyebrow today,  there is no painful sensation in the right nasolabial area at the V2 region of the nerve as before. CN VII: upper and lower face symmetric CN VIII: Hearing is now intact CN IX, X: Intact gag, uvula midline CN XI: Sternocleidomastoid and trapezius muscles intact CN XII: Tongue midline.  Area inside the mouth, shows mucosal tenderness, especially at the right upper gums. Bulk & Tone: Normal, no fasciculations  motor: Muscle strength 5 out of 5 throughout  sensation: Pinprick, temperature and vibratory sensation intact. Deep Tendon Reflexes: 2+ throughout, toes downgoing.  Finger to nose testing: Without dysmetria Heel to shin: Without dysmetria.   Gait: Normal station and stride.       Marlowe Kays, PA-C    Time spent: 38 minutes.

## 2022-12-28 NOTE — Telephone Encounter (Signed)
I could not find a specific interaction between metoprolol and CMZ. Is this a problem? Thanks for checking

## 2023-02-18 DIAGNOSIS — Z125 Encounter for screening for malignant neoplasm of prostate: Secondary | ICD-10-CM | POA: Diagnosis not present

## 2023-02-18 DIAGNOSIS — E7800 Pure hypercholesterolemia, unspecified: Secondary | ICD-10-CM | POA: Diagnosis not present

## 2023-02-23 DIAGNOSIS — I700 Atherosclerosis of aorta: Secondary | ICD-10-CM | POA: Diagnosis not present

## 2023-02-23 DIAGNOSIS — G500 Trigeminal neuralgia: Secondary | ICD-10-CM | POA: Diagnosis not present

## 2023-02-23 DIAGNOSIS — Z0000 Encounter for general adult medical examination without abnormal findings: Secondary | ICD-10-CM | POA: Diagnosis not present

## 2023-02-23 DIAGNOSIS — H548 Legal blindness, as defined in USA: Secondary | ICD-10-CM | POA: Diagnosis not present

## 2023-02-23 DIAGNOSIS — Z6834 Body mass index (BMI) 34.0-34.9, adult: Secondary | ICD-10-CM | POA: Diagnosis not present

## 2023-02-23 DIAGNOSIS — R55 Syncope and collapse: Secondary | ICD-10-CM | POA: Diagnosis not present

## 2023-02-23 DIAGNOSIS — I2510 Atherosclerotic heart disease of native coronary artery without angina pectoris: Secondary | ICD-10-CM | POA: Diagnosis not present

## 2023-02-23 DIAGNOSIS — R0681 Apnea, not elsewhere classified: Secondary | ICD-10-CM | POA: Diagnosis not present

## 2023-02-23 DIAGNOSIS — I420 Dilated cardiomyopathy: Secondary | ICD-10-CM | POA: Diagnosis not present

## 2023-02-24 ENCOUNTER — Telehealth: Payer: Self-pay | Admitting: Licensed Clinical Social Worker

## 2023-02-24 NOTE — Telephone Encounter (Signed)
Patient is aware of upcoming appointments times/dates 

## 2023-03-11 ENCOUNTER — Encounter: Payer: Self-pay | Admitting: Physician Assistant

## 2023-03-11 ENCOUNTER — Ambulatory Visit: Payer: Medicare PPO | Admitting: Physician Assistant

## 2023-03-11 VITALS — BP 138/82 | HR 75 | Resp 20 | Ht 73.0 in | Wt 254.0 lb

## 2023-03-11 DIAGNOSIS — G500 Trigeminal neuralgia: Secondary | ICD-10-CM | POA: Diagnosis not present

## 2023-03-11 MED ORDER — BACLOFEN 5 MG PO TABS: ORAL_TABLET | ORAL | 3 refills | Status: DC

## 2023-03-11 NOTE — Patient Instructions (Signed)
  Continue gabapentin  900 mg 3 times a day  Reduce the Tegretol 200 mg to daytime Start Baclofen 5 mg take half 2.5 mg at bedtime for 1 week  Then increase Baclofen to 2.5 mg  twice a day having discontinued the Tegretol and you can move it to up to 5 mg twice a day   Side effects include sleepiness  Follow up  in  2 months

## 2023-03-11 NOTE — Progress Notes (Signed)
NEUROLOGY CONSULTATION NOTE  Carlos Suarez MRN: [id number] DOB: 02-Jan-1946   Referring provider: [name], MD  Primary care provider: [name], MD   Reason for consult:  facial and maxillary pain   Assessment/Plan:   Right facial pain, maxillary pain likely due to trigeminal neuralgia    This is a delightful 77 yo man with 10 month history of unresolving right facial pain, following a V2 pattern, suspicious for trigeminal neuralgia.  Prior MRI of the brain was essentially unremarkable, with a dominant left vertebral artery as well as noted partial bilateral mastoid opacification.  MRA of the face to further evaluate the trigeminal nerve was negative for enhancement or masslike findings.  Of note, the right superior cerebellar artery trace versus next to the right trigeminal nerve was just beyond the root entry zone. There is no evidence of fracture or aggressive bone lesions.  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  He began treatment with gabapentin 100 mg 3 times daily, eventually increasing the doses. He is currently on Gabapentin 900 mg tid. He is also on tegretol 200 mg bid, although has not had a therapeutic response.   He is otherwise doing well, without any new medical diagnoses.  He is able to perform activities of daily living without difficulty.Discussed with a colleague, dr. Karel Jarvis regarding other therapeutic options.     Recommendations  Continue gabapentin 900 mg 3 times daily. prescription sent to pharmacy. Side effects priorly discussed Will wean off Tegretol 200 mg,1 tab twice daily  to once daily for a week, then discontinue it Start Baclofen 2.5 mg during the day  for 1 week, then increase to 2.5 mg bid for 1 week, then increase to 5 mg bid if not therapeutic. Side effects discussed  Follow-up in 2 months  Subjective:    Patient returns today in follow-up.  As recall, the patient was doing well for quite some time, and during his last visit  he after missing a dose his symptoms were worse when touching his mustache, that would elicit the pain "but not always". The last 2 days briefly touching  the face, it  has elicited a throbbing pain disappearing after breakfast. Crunching anywhere in the mouth does not cause sharp pain, "just some sensitivity"  He also had pain around the nasolabial line, and at the oral mucosa. If there is a vibration can cause some pain.  Also, the pain is triggered by conversations, brushing, "from the right nose to the right eye " . Sometimes he has "a weird feeling when leaning the face forward".  No mental status changes.  He is alert, oriented, and does not demonstrate any memory deficiencies, he is very eloquent and able to demonstrate adequate, if not excellent memory. Sometimes he is sleepy.    Initial visit 02/06/2022 this is a very pleasant 77 year old man with a history of hypertension, hyperlipidemia, CAD status post stent placement, seen today for evaluation of R facial and maxillary pain. In review, in December, the patient was seen by a dentist due to tooth sensitivity, requiring dental work. At the time, he also reported sensitivity in the right eyebrow in a vertical line down to the R side of the nose and R nasolabial fold.   He tried topical therapy that did not help.  Dentist suggested that they might be sinuses due to light pressure on the right maxillary area causing tenderness. Denies numbness or tingling, being able to reproduce the pain, "at one point, I felt the pain  was coming from the neck". The R eyebrow pain is now "coming and going, about a 2 or 3". Maxillary CT was negative for abnormal findings. He tried Flonase and chlorpheniramine for 6 weeks, only improving to 50 percent, but at times is severe. He denies any change in the sense of smell,  cold intolerance, head injury, pain with chewing. Initially the pain was extending to the right temple but this subsided. 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 No recent infection but his wife had shingles 1 months ago.  He was seen by ophthalmology as well, as 50 years ago he had eye trauma s/ scleral buckle becoming legally blind , but ophthalmic source was ruled out.        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  Normal  marrow signal, dominant left vertebral artery, other major intracranial flow voids are preserved.  MRI of the face with trigeminal view with and without contrast 02/23/2022 negative for mass or inflammation, right superior cerebellar artery transverses in close proximity to the right trigeminal nerve near the root entry zone.  PAST MEDICAL HISTORY: Past Medical History:  Diagnosis Date   Legally blind in right eye, as defined in [place]     PAST SURGICAL HISTORY: Past Surgical History:  Procedure Laterality Date   CORONARY STENT INTERVENTION N/A 09/10/2021   Procedure: CORONARY STENT INTERVENTION;  Surgeon: Marykay Lex, MD;  Location[facility] INVASIVE CV LAB;  Service: Cardiovascular;  Laterality: N/A;   LEFT HEART CATH AND CORONARY ANGIOGRAPHY N/A 09/10/2021   Procedure: LEFT HEART CATH AND CORONARY ANGIOGRAPHY;  Surgeon: Marykay Lex, MD;  Location: [facility] INVASIVE CV LAB;  Service: Cardiovascular;  Laterality: N/A;    MEDICATIONS: Current Outpatient Medications on File Prior to Visit  Medication Sig Dispense Refill   aspirin EC 81 MG tablet Take 81 mg by mouth daily. Swallow whole.     carbamazepine (TEGRETOL) 200 MG tablet Take one tablet at night for 1 week then increase to one tablet twice a day 60 tablet 11   gabapentin (NEURONTIN) 300 MG capsule Take  3 tabs  3 times a day 270 capsule 3   metoprolol succinate (TOPROL-XL) 50 MG 24 hr tablet TAKE 1 TABLET BY MOUTH EVERY DAY 90 tablet 3    nitroGLYCERIN (NITROSTAT) 0.4 MG SL tablet Place 1 tablet (0.4 mg total) under the tongue every 5 (five) minutes as needed for chest pain. 25 tablet 0   rosuvastatin (CRESTOR) 40 MG tablet TAKE 1 TABLET BY MOUTH EVERY DAY 90 tablet 2   No current facility-administered medications on file prior to visit.    ALLERGIES: Allergies  Allergen Reactions   Fluothane [Halothane] Nausea And Vomiting   Oxycodone Other (See Comments)    confusion   Oxycodone Hcl Other (See Comments)    FAMILY HISTORY: No family history on file.    Objective:    General: NAD, well-groomed  Head: Normocephalic  Eyes: Right eye legally blind after blunt trauma to the eye  Neck: supple, no paraspinal tenderness, full range of motion Back: No paraspinal tenderness Neurological Exam: Mental status: Alert and oriented to person, place and time, recent and remote memory intact, follows knowledge intact, attention and concentration intact, speech fluent and not dysarthric, language intact.   Cranial nerves: CN I:  not tested CN II: L pupils equal, round and reactive to light, visual fields intact.  Right lens opaque CN III, IV, VI: Full ROM, no nystagmus, no proptosis.  He has decreased vision on the right (legally blind). CN V: Facial sensation intact no tenderness to palpation on the right eyebrow today, there is no painful sensation in the right nasolabial area at the V2 region of the nerve as before. CN VII: upper and lower face symmetric CN VIII: Hearing is now intact CN IX, X: Intact gag, uvula midline CN XI: Sternocleidomastoid and trapezius muscles intact CN XII: Tongue midline.  Area inside the mouth, shows mucosal tenderness, especially at the frontal tooth and incisor area Bulk & Tone: Normal, no fasciculations  motor: Muscle strength 5 out of 5 throughout  sensation: Pinprick, temperature and vibratory sensation intact. Deep Tendon Reflexes: 2+ throughout, toes downgoing.  Finger to nose testing:  Without dysmetria Heel to shin: Without dysmetria.   Gait: Normal station and stride.       Marlowe Kays, PA-C    Time spent: 38 minutes.

## 2023-03-12 ENCOUNTER — Other Ambulatory Visit: Payer: Self-pay

## 2023-03-12 NOTE — Addendum Note (Signed)
Addended by: Allean Found R on: 03/12/2023 03:39 PM   Modules accepted: Orders

## 2023-03-14 MED ORDER — BACLOFEN 5 MG PO TABS: ORAL_TABLET | ORAL | 3 refills | Status: DC

## 2023-03-17 ENCOUNTER — Ambulatory Visit: Payer: Medicare PPO | Admitting: Physician Assistant

## 2023-03-29 ENCOUNTER — Inpatient Hospital Stay: Payer: Medicare PPO

## 2023-03-29 ENCOUNTER — Other Ambulatory Visit: Payer: Self-pay | Admitting: Genetic Counselor

## 2023-03-29 ENCOUNTER — Inpatient Hospital Stay: Payer: Medicare PPO | Attending: Genetic Counselor | Admitting: Genetic Counselor

## 2023-03-29 ENCOUNTER — Encounter: Payer: Self-pay | Admitting: Genetic Counselor

## 2023-03-29 ENCOUNTER — Other Ambulatory Visit: Payer: Self-pay

## 2023-03-29 DIAGNOSIS — Z800 Family history of malignant neoplasm of digestive organs: Secondary | ICD-10-CM | POA: Diagnosis not present

## 2023-03-29 DIAGNOSIS — Z803 Family history of malignant neoplasm of breast: Secondary | ICD-10-CM | POA: Diagnosis not present

## 2023-03-29 DIAGNOSIS — Z1371 Encounter for nonprocreative screening for genetic disease carrier status: Secondary | ICD-10-CM

## 2023-03-29 LAB — GENETIC SCREENING ORDER

## 2023-03-30 ENCOUNTER — Encounter: Payer: Self-pay | Admitting: Genetic Counselor

## 2023-03-30 DIAGNOSIS — Z803 Family history of malignant neoplasm of breast: Secondary | ICD-10-CM | POA: Insufficient documentation

## 2023-03-30 DIAGNOSIS — R55 Syncope and collapse: Secondary | ICD-10-CM | POA: Diagnosis not present

## 2023-03-30 DIAGNOSIS — Z800 Family history of malignant neoplasm of digestive organs: Secondary | ICD-10-CM | POA: Insufficient documentation

## 2023-03-30 NOTE — Progress Notes (Signed)
REFERRING PROVIDER: [name], MD [address] [place],  [place] [address]  PRIMARY PROVIDER:  [name], MD  PRIMARY REASON FOR VISIT:  1. Family history of pancreatic cancer   2. Family history of breast cancer in male      HISTORY OF PRESENT ILLNESS:   Carlos Suarez, a 77 y.o. male, was seen for a [facility] cancer genetics consultation at the request of Dr. Renne Crigler due to a family history of male breast cancer and a familial MUTYH single pathogenic variant.  Carlos Suarez presents to clinic today to discuss the possibility of a hereditary predisposition to cancer, genetic testing, and to further clarify his future cancer risks, as well as potential cancer risks for family members.   Carlos Suarez is a 77 y.o. male with no personal history of cancer.    CANCER HISTORY:  Oncology History   No history exists.      Past Medical History:  Diagnosis Date   Family history of breast cancer in male    Family history of pancreatic cancer    Legally blind in right eye, as defined in [place]     Past Surgical History:  Procedure Laterality Date   CORONARY STENT INTERVENTION N/A 09/10/2021   Procedure: CORONARY STENT INTERVENTION;  Surgeon: Marykay Lex, MD;  Location: [facility] INVASIVE CV LAB;  Service: Cardiovascular;  Laterality: N/A;   LEFT HEART CATH AND CORONARY ANGIOGRAPHY N/A 09/10/2021   Procedure: LEFT HEART CATH AND CORONARY ANGIOGRAPHY;  Surgeon: Marykay Lex, MD;  Location: [facility] INVASIVE CV LAB;  Service: Cardiovascular;  Laterality: N/A;    Social History   Socioeconomic History   Marital status: Married    Spouse name: Not on file   Number of children: Not on file   Years of education: Not on file   Highest education level: Not on file  Occupational History   Not on file  Tobacco Use   Smoking status: Never   Smokeless tobacco: Never  Vaping Use   Vaping status: Never Used  Substance and Sexual Activity   Alcohol use: Never   Drug use:  Never   Sexual activity: Not on file  Other Topics Concern   Not on file  Social History Narrative   Right handed   Drinks caffeine   2 story home   Lives with wife   [place]      Social Determinants of Health   Financial Resource Strain: Not on file  Food Insecurity: Not on file  Transportation Needs: No Transportation Needs (04/22/2022)   PRAPARE - Transportation    Lack of Transportation (Medical): No    Lack of Transportation (Non-Medical): No  Physical Activity: Not on file  Stress: Not on file  Social Connections: Not on file     FAMILY HISTORY:  We obtained a detailed, 4-generation family history.  Significant diagnoses are listed below: Family History  Problem Relation Age of Onset   Breast cancer Brother 5   Pancreatic cancer Cousin        pat first cousin   Pancreatic cancer Cousin        pat first cousin     The patient has two daughters who are cancer free.  He has two brothers, one who has breast cancer and had genetic testing which identified a single pathogenic variant in MUTYH.  His parents are deceased.  The patient's mother died of a coronary thrombosis at 64.  She had three brothers who were  cancer free.  Her parents died of natural causes.  The patient's father died at 34.  He had two sisters and three brothers, all were cancer free. One sister and one brother had sons who died of pancreatic cancer. His parents died of natural causes.  Carlos Suarez is unaware of previous family history of genetic testing for hereditary cancer risks. Patient's maternal ancestors are of [place] descent, and paternal ancestors are of [place] descent. There is no reported Ashkenazi Jewish ancestry. There is no known consanguinity.  GENETIC COUNSELING ASSESSMENT: Mr. Burkland is a 77 y.o. male with a family history of cancer which is somewhat suggestive of a hereditary cancer syndrome and predisposition to cancer given the male breast cancer and known  MUTYH mutations. We, therefore, discussed and recommended the following at today's visit.   DISCUSSION: We discussed that, in general, most cancer is not inherited in families, but instead is sporadic or familial. Sporadic cancers occur by chance and typically happen at older ages (>50 years) as this type of cancer is caused by genetic changes acquired during an individual's lifetime. Some families have more cancers than would be expected by chance; however, the ages or types of cancer are not consistent with a known genetic mutation or known genetic mutations have been ruled out. This type of familial cancer is thought to be due to a combination of multiple genetic, environmental, hormonal, and lifestyle factors. While this combination of factors likely increases the risk of cancer, the exact source of this risk is not currently identifiable or testable.  We discussed that 2 out of 3 men with breast cancer have a hereditary predisposition to their cancer, with most cases associated with BRCA mutations.  There are other genes that can be associated with hereditary male breast cancer syndromes.  These include CHEK2 and PALB2.  Mr. Blackburn's brother underwent genetic testing through [facility] and was found to have a single pathogenic variant in MUTYH.  We reviewed MUTYH autosomal recessive inheritance and that his mutation would not increase his risk for cancer, but if his wife also had one pathogenic variant his daughters could be at risk.  We discussed that testing is beneficial for several reasons including knowing how to follow individuals after completing their treatment, identifying whether potential treatment options such as PARP inhibitors would be beneficial, and understand if other family members could be at risk for cancer and allow them to undergo genetic testing.   We reviewed the characteristics, features and inheritance patterns of hereditary cancer syndromes. We also discussed genetic  testing, including the appropriate family members to test, the process of testing, insurance coverage and turn-around-time for results. We discussed the implications of a negative, positive, carrier and/or variant of uncertain significant result. Mr. Colandrea  was offered a common hereditary cancer panel (47 genes) and an expanded pan-cancer panel (77 genes). Mr. Janoff was informed of the benefits and limitations of each panel, including that expanded pan-cancer panels contain genes that do not have clear management guidelines at this point in time.  We also discussed that as the number of genes included on a panel increases, the chances of variants of uncertain significance increases. Mr. Gelin decided to pursue genetic testing for the CancerNext-Expanded+RNAinsight [place] panel.   The CancerNext-Expanded [place] panel offered by [facility] and includes sequencing and rearrangement analysis for the following 77 genes: AIP, ALK, APC*, ATM*, AXIN2, BAP1, BARD1, BMPR1A, BRCA1*, BRCA2*, BRIP1*, CDC73, CDH1*, CDK4, CDKN1B, CDKN2A, CHEK2*, CTNNA1, DICER1, FH, FLCN, KIF1B, LZTR1, MAX, MEN1,  MET, MLH1*, MSH2*, MSH3, MSH6*, MUTYH*, NF1*, NF2, NTHL1, PALB2*, PHOX2B, PMS2*, POT1, PRKAR1A, PTCH1, PTEN*, RAD51C*, RAD51D*, RB1, RET, SDHA, SDHAF2, SDHB, SDHC, SDHD, SMAD4, SMARCA4, SMARCB1, SMARCE1, STK11, SUFU, TMEM127, TP53*, TSC1, TSC2, and VHL (sequencing and deletion/duplication); EGFR, EGLN1, HOXB13, KIT, MITF, PDGFRA, POLD1, and POLE (sequencing only); EPCAM and GREM1 (deletion/duplication only). DNA and RNA analyses performed for * genes.   Based on Mr. Winkel's family history of cancer, he meets medical criteria for genetic testing. Despite that he meets criteria, he may still have an out of pocket cost. We discussed that if his out of pocket cost for testing is over $100, the laboratory will call and confirm whether he wants to proceed with testing.  If the out of pocket cost of testing is less than  $100 he will be billed by the genetic testing laboratory.   We discussed that some people do not want to undergo genetic testing due to fear of genetic discrimination.  The Genetic Information Nondiscrimination Act (GINA) was signed into federal law in 2008. GINA prohibits health insurers and most employers from discriminating against individuals based on genetic information (including the results of genetic tests and family history information). According to GINA, health insurance companies cannot consider genetic information to be a preexisting condition, nor can they use it to make decisions regarding coverage or rates. GINA also makes it illegal for most employers to use genetic information in making decisions about hiring, firing, promotion, or terms of employment. It is important to note that GINA does not offer protections for life insurance, disability insurance, or long-term care insurance. GINA does not apply to those in the [employer], those who work for companies with less than 15 employees, and new life insurance or long-term disability insurance policies.  Health status due to a cancer diagnosis is not protected under GINA. More information about GINA can be found by visiting [email or web address].   PLAN: After considering the risks, benefits, and limitations, Mr. Zambo provided informed consent to pursue genetic testing and the blood sample was sent to [facility] for analysis of the CancerNext-Expanded+RNAinsight. Results should be available within approximately 2-3 weeks' time, at which point they will be disclosed by telephone to Mr. Shiley, as will any additional recommendations warranted by these results. Mr. Ybarbo will receive a summary of his genetic counseling visit and a copy of his results once available. This information will also be available in Epic.   Lastly, we encouraged Mr. Mandigo to remain in contact with cancer genetics annually so that we can  continuously update the family history and inform him of any changes in cancer genetics and testing that may be of benefit for this family.   Mr. Solla questions were answered to his satisfaction today. Our contact information was provided should additional questions or concerns arise. Thank you for the referral and allowing [place] to share in the care of your patient.   Denicia Pagliarulo P. Lowell Guitar, MS, [facility] Licensed, Patent attorney [email or web address] phone: [phone]  The patient was seen for a total of 40 minutes in face-to-face genetic counseling.  The patient was seen alone.  Drs. Meliton Rattan, and/or [place] were available for questions, if needed..    _______________________________________________________________________ For Office Staff:  Number of people involved in session: 1 Was an Intern/ student involved with case: no

## 2023-04-05 DIAGNOSIS — E7800 Pure hypercholesterolemia, unspecified: Secondary | ICD-10-CM | POA: Diagnosis not present

## 2023-04-07 DIAGNOSIS — I420 Dilated cardiomyopathy: Secondary | ICD-10-CM | POA: Diagnosis not present

## 2023-04-07 DIAGNOSIS — E7800 Pure hypercholesterolemia, unspecified: Secondary | ICD-10-CM | POA: Diagnosis not present

## 2023-04-14 ENCOUNTER — Encounter: Payer: Self-pay | Admitting: Genetic Counselor

## 2023-04-14 ENCOUNTER — Telehealth: Payer: Self-pay | Admitting: Genetic Counselor

## 2023-04-14 ENCOUNTER — Ambulatory Visit: Payer: Self-pay | Admitting: Genetic Counselor

## 2023-04-14 DIAGNOSIS — Z1379 Encounter for other screening for genetic and chromosomal anomalies: Secondary | ICD-10-CM | POA: Insufficient documentation

## 2023-04-14 NOTE — Progress Notes (Signed)
HPI:  [name] was previously seen in the [facility] due to a family history of male breast cancer and concerns regarding a hereditary predisposition to cancer due to his brother's genetic testing identifying a single MUTYH mutation. Please refer to our prior cancer genetics clinic note for more information regarding our discussion, assessment and recommendations, at the time. [name] recent genetic test results were disclosed to him, as were recommendations warranted by these results. These results and recommendations are discussed in more detail below.  CANCER HISTORY:  Oncology History   No history exists.    FAMILY HISTORY:  We obtained a detailed, 4-generation family history.  Significant diagnoses are listed below: Family History  Problem Relation Age of Onset   Breast cancer Brother 63   Pancreatic cancer Cousin        pat first cousin   Pancreatic cancer Cousin        pat first cousin       The patient has two daughters who are cancer free.  He has two brothers, one who has breast cancer and had genetic testing which identified a single pathogenic variant in MUTYH.  His parents are deceased.   The patient's mother died of a coronary thrombosis at 3.  She had three brothers who were cancer free.  Her parents died of natural causes.   The patient's father died at 78.  He had two sisters and three brothers, all were cancer free. One sister and one brother had sons who died of pancreatic cancer. His parents died of natural causes.   [name] is unaware of previous family history of genetic testing for hereditary cancer risks. Patient's maternal ancestors are of [place] descent, and paternal ancestors are of [place] descent. There is no reported Ashkenazi Jewish ancestry. There is no known consanguinity  GENETIC TEST RESULTS: Genetic testing reported out on April 09, 2023 through the CancerNext-Expanded+RNAinsight cancer panel found no  pathogenic mutations. The CancerNext-Expanded [place] panel offered by [facility] and includes sequencing and rearrangement analysis for the following 77 genes: AIP, ALK, APC*, ATM*, AXIN2, BAP1, BARD1, BMPR1A, BRCA1*, BRCA2*, BRIP1*, CDC73, CDH1*, CDK4, CDKN1B, CDKN2A, CHEK2*, CTNNA1, DICER1, FH, FLCN, KIF1B, LZTR1, MAX, MEN1, MET, MLH1*, MSH2*, MSH3, MSH6*, MUTYH*, NF1*, NF2, NTHL1, PALB2*, PHOX2B, PMS2*, POT1, PRKAR1A, PTCH1, PTEN*, RAD51C*, RAD51D*, RB1, RET, SDHA, SDHAF2, SDHB, SDHC, SDHD, SMAD4, SMARCA4, SMARCB1, SMARCE1, STK11, SUFU, TMEM127, TP53*, TSC1, TSC2, and VHL (sequencing and deletion/duplication); EGFR, EGLN1, HOXB13, KIT, MITF, PDGFRA, POLD1, and POLE (sequencing only); EPCAM and GREM1 (deletion/duplication only). DNA and RNA analyses performed for * genes. The test report has been scanned into EPIC and is located under the Molecular Pathology section of the Results Review tab.  A portion of the result report is included below for reference.     [name] test was normal and did not reveal the familial mutation. We call this result a true negative result because the cancer-causing mutation was identified in [name]'s family, and he did not inherit it.      ADDITIONAL GENETIC TESTING: We discussed with [name] that his genetic testing was fairly extensive.  If there are genes identified to increase cancer risk that can be analyzed in the future, we would be happy to discuss and coordinate this testing at that time.    CANCER SCREENING RECOMMENDATIONS: [name] test result is considered negative (normal).   Most cancers happen by chance and this negative test suggests that his family history of cancer  may fall into this category.    Possible reasons for [name]'s negative genetic test include:  1. There may be a Carlos Suarez mutation in one of these genes that current testing methods cannot detect but that chance is small.  2. There could be another Carlos Suarez that  has not yet been discovered, or that we have not yet tested, that is responsible for the cancer diagnoses in the family.  3.  There may be no hereditary risk for cancer in the family. The cancers in [name] and/or his family may be sporadic/familial or due to other genetic and environmental factors.  Therefore, it is recommended he continue to follow the cancer management and screening guidelines provided by his primary healthcare provider. An individual's cancer risk and medical management are not determined by genetic test results alone. Overall cancer risk assessment incorporates additional factors, including personal medical history, family history, and any available genetic information that may result in a personalized plan for cancer prevention and surveillance  RECOMMENDATIONS FOR FAMILY MEMBERS:  Individuals in this family might be at some increased risk of developing cancer, over the general population risk, simply due to the family history of cancer.  We recommended women in this family have a yearly mammogram beginning at age 71, or 46 years younger than the earliest onset of cancer, an annual clinical breast exam, and perform monthly breast self-exams. Women in this family should also have a gynecological exam as recommended by their primary provider. All family members should be referred for colonoscopy starting at age 51.  FOLLOW-UP: Lastly, we discussed with [name] that cancer genetics is a rapidly advancing field and it is possible that new genetic tests will be appropriate for him and/or his family members in the future. We encouraged him to remain in contact with cancer genetics on an annual basis so we can update his personal and family histories and let him know of advances in cancer genetics that may benefit this family.   Our contact number was provided. [name] questions were answered to his satisfaction, and he knows he is welcome to call us at anytime with  additional questions or concerns.   Maylon Cos, MS, [facility] Licensed, Certified Genetic Counselor [email or web address]

## 2023-04-14 NOTE — Telephone Encounter (Signed)
Revealed negative genetic testing on the CancerNext-Expanded+RNAinsight.  His brother had an MUTYH pathogenic mutation, which was not found on his test.

## 2023-04-20 ENCOUNTER — Institutional Professional Consult (permissible substitution): Payer: Medicare PPO | Admitting: Primary Care

## 2023-04-20 DIAGNOSIS — R55 Syncope and collapse: Secondary | ICD-10-CM | POA: Diagnosis not present

## 2023-05-05 ENCOUNTER — Encounter: Payer: Self-pay | Admitting: Cardiovascular Disease

## 2023-05-05 ENCOUNTER — Ambulatory Visit: Payer: Medicare PPO | Attending: Cardiovascular Disease | Admitting: Cardiovascular Disease

## 2023-05-05 VITALS — BP 130/68 | HR 74 | Ht 73.0 in | Wt 252.4 lb

## 2023-05-05 DIAGNOSIS — I493 Ventricular premature depolarization: Secondary | ICD-10-CM | POA: Diagnosis not present

## 2023-05-05 DIAGNOSIS — I2510 Atherosclerotic heart disease of native coronary artery without angina pectoris: Secondary | ICD-10-CM | POA: Diagnosis not present

## 2023-05-05 DIAGNOSIS — I454 Nonspecific intraventricular block: Secondary | ICD-10-CM | POA: Diagnosis not present

## 2023-05-05 DIAGNOSIS — I420 Dilated cardiomyopathy: Secondary | ICD-10-CM

## 2023-05-05 NOTE — Patient Instructions (Signed)
Medication Instructions:  No changes *If you need a refill on your cardiac medications before your next appointment, please call your pharmacy*  Testing/Procedures: Your physician has requested that you have an echocardiogram 03/2024 before 1 year follow up appointment. Echocardiography is a painless test that uses sound waves to create images of your heart. It provides your doctor with information about the size and shape of your heart and how well your heart's chambers and valves are working. This procedure takes approximately one hour. There are no restrictions for this procedure. Please do NOT wear cologne, perfume, aftershave, or lotions (deodorant is allowed). Please arrive 15 minutes prior to your appointment time.    Follow-Up: At [facility], you and your health needs are our priority.  As part of our continuing mission to provide you with exceptional heart care, we have created designated Provider Care Teams.  These Care Teams include your primary Cardiologist (physician) and Advanced Practice Providers (APPs -  Physician Assistants and Nurse Practitioners) who all work together to provide you with the care you need, when you need it.  We recommend signing up for the patient portal called "MyChart".  Sign up information is provided on this After Visit Summary.  MyChart is used to connect with patients for Virtual Visits (Telemedicine).  Patients are able to view lab/test results, encounter notes, upcoming appointments, etc.  Non-urgent messages can be sent to your provider as well.   To learn more about what you can do with MyChart, go to [email or web address].    Your next appointment:   1 year(s)  Provider:   Thurmon Fair, MD

## 2023-05-05 NOTE — Progress Notes (Signed)
Cardiology Office Note:    Date:  05/09/2023   ID:  Carlos Suarez, DOB 1946-08-13, MRN [id number]  PCP:  [name], MD   [facility] Providers Cardiologist:  Thurmon Fair, MD     Referring MD: [name], MD   No chief complaint on file.   History of Present Illness:    Carlos Suarez is a 77 y.o. 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 Due to an abnormal stress test and dilated LV with decreased LVEF (45-50% on echo, 40% QGS), he underwent cardiac catheterization that showed a 85% mid RCA stenosis that was stented (Synergy DES 4.0x 18 Sep 10, 2021), also noted to have tandem 50-60% ramus intermedius artery stenoses. Follow up echo in May 2023 showed no improvement in LV function.  He is feeling well and denies angina or dyspnea at rest or with most activities.  He is able to mow, weed eat and leaf below his entire yard without stopping to rest.  He did get a little winded when he had to climb a steep hill leading up from a boat dock at a recent wedding.  He has occasional dizziness if he jumps up too quickly.  During stress testing and during the heart catheterization he had very frequent PVCs. Amiodarone was initiated at the time of cardiac cath, when he was also started on clopidogrel (for 6 months), Jardiance and metoprolol. Zetia was started relatively recently as well.  He developed a pruritic slightly raised erythematous rash after starting amiodarone, ezetimibe and Jardiance, that resolved after discontinuation of these medication.  Amiodarone and Jardiance have been discontinued.  He has continued on metoprolol is now back on ezetimibe and this has not caused any rashes.  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  There is late systolic mild-moderate mitral insufficiency.  LVEF indeed remains mildly depressed around 45%, but the pattern appears to be global.  He is not troubled by palpitations, has not experienced syncope.  Denies lower extremity edema or claudication.  No other focal neurological complaints.  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     Past Medical History:  Diagnosis Date   Family history of breast cancer in male    Family history of pancreatic cancer    Legally blind in right eye, as defined in [place]     Past Surgical History:  Procedure Laterality Date   CORONARY STENT INTERVENTION N/A 09/10/2021   Procedure: CORONARY STENT INTERVENTION;  Surgeon: Marykay Lex, MD;  Location: [facility] INVASIVE CV LAB;  Service: Cardiovascular;  Laterality: N/A;   LEFT HEART CATH AND CORONARY ANGIOGRAPHY N/A 09/10/2021   Procedure: LEFT HEART CATH AND CORONARY ANGIOGRAPHY;  Surgeon: Marykay Lex, MD;  Location: [facility] INVASIVE CV LAB;  Service: Cardiovascular;  Laterality: N/A;    Current Medications: Current Meds  Medication Sig   aspirin EC 81 MG tablet Take 81 mg by mouth daily. Swallow whole.   Baclofen 5 MG TABS Take 5 mg by mouth in the morning and at bedtime.   ezetimibe (ZETIA) 10 MG tablet    gabapentin (NEURONTIN) 300 MG capsule Take 300 mg by mouth 3 (three) times daily. Take three 300 mg capsules three  times  daily   metoprolol succinate (TOPROL-XL) 50 MG 24 hr tablet TAKE 1 TABLET BY MOUTH EVERY DAY   rosuvastatin (CRESTOR) 40 MG tablet TAKE 1 TABLET BY MOUTH EVERY DAY     Allergies:   Fluothane [halothane], Oxycodone, and Oxycodone hcl   Social History   Socioeconomic History   Marital status: Married    Spouse name: Not on file   Number of children: Not on file   Years of education: Not on file   Highest education level: Not on file   Occupational History   Not on file  Tobacco Use   Smoking status: Never   Smokeless tobacco: Never  Vaping Use   Vaping status: Never Used  Substance and Sexual Activity   Alcohol use: Never   Drug use: Never   Sexual activity: Not on file  Other Topics Concern   Not on file  Social History Narrative   Right handed   Drinks caffeine   2 story home   Lives with wife   [place]      Social Determinants of Health   Financial Resource Strain: Not on file  Food Insecurity: Not on file  Transportation Needs: No Transportation Needs (04/22/2022)   PRAPARE - Administrator, Civil Service (Medical): No    Lack of Transportation (Non-Medical): No  Physical Activity: Not on file  Stress: Not on file  Social Connections: Not on file     Family History: The patient's only history is significant for the absence of early onset CAD or CHF, known cardiomyopathy or unexplained premature death  ROS:   Please see the history of present illness.     All other systems reviewed and are negative.  EKGs/Labs/Other Studies Reviewed:   Cardiac catheterization   Mid RCA lesion is 85% stenosed.   A drug-eluting stent was successfully placed using a STENT ONYX FRONTIER 4.0X18.   Post intervention, there is a 0% residual stenosis.   --------------------------------------------   Dist LAD lesion is 45% stenosed.   Ramus-1 lesion is 50% stenosed. Ramus-2 lesion is 50% stenosed.   --------------------------------------------   There is mild to moderate left ventricular systolic dysfunction.  The left ventricular ejection fraction is 35-45% by visual estimate.   LV end diastolic pressure is normal.   There is no aortic valve stenosis.   SUMMARY Diffuse coronary disease with severe single-vessel disease, mid RCA 85% stenosis, ramus intermedius tandem 50 to 60% stenoses Successful DES PCI of mid RCA focal 85% stenosis - reducing to 0%: Synergy DES 4.0 mm x 18 mm  deployed to 4.1 mm TIMI-3 flow pre and post Mildly reduced LVEF of roughly 40 to 45%-inferior hypokinesis. Normal LVEDP     RECOMMENDATIONS Monitor post PCI overnight.  Anticipate morning discharge Start low-dose Toprol, increase statin to 40 mg Based on this of significance of PVCs and reduced EF, will load with amiodarone 4 mg twice daily with plan for 1 week followed by 20 mg twice daily for 1 week and then 200 mg daily. Uninterrupted DAPT (CLOPIDOGREL 75 MG, ASPIRIN 81 MG) X 6 months min   The following studies were reviewed today: 08/06/2021 pharmacological nuclear perfusion study   Findings are consistent with no prior ischemia and no prior myocardial infarction. The study is intermediate risk.   No ST deviation was noted.   LV perfusion is normal.   Left ventricular function is abnormal. Global function is moderately reduced. Nuclear stress EF: 40 %. The left ventricular  ejection fraction is moderately decreased (30-44%). End diastolic cavity size is mildly enlarged.   Prior study not available for comparison.   Intermediate risk study with normal myocardial perfusion, but with a dilated left ventricle and moderately reduced global systolic function. Findings suggest nonischemic dilated cardiomyopathy, but gated images could be causing artifactual underestimation of left ventricular function due to frequent PVCs. Recommend correlation with echocardiogram.  Echocardiogram 12/29/2020   1. Left ventricular ejection fraction, by estimation, is 45 to 50%. The left ventricle has mildly decreased function. The left ventricle demonstrates regional wall motion abnormalities (abnormal septal motion). Left ventricular diastolic parameters are consistent with Grade I diastolic dysfunction (impaired relaxation).   2. Right ventricular systolic function is normal. The right ventricular size is normal. There is normal pulmonary artery systolic pressure. The estimated right ventricular systolic  pressure is 21.1 mmHg.   3. The mitral valve is normal in structure. Mild to moderate mitral valve regurgitation. No evidence of mitral stenosis.   4. The aortic valve is tricuspid. Aortic valve regurgitation is mild to moderate.   5. Frequent ectopy.   Comparison(s): Similar to prior, valve regurgitation appears worse during post PVC beats.   Echocardiogram 12/29/2021  1. Left ventricular ejection fraction, by estimation, is 45 to 50%. The  left ventricle has mildly decreased function. The left ventricle  demonstrates regional wall motion abnormalities (abnormal septal motion).  Left ventricular diastolic parameters are  consistent with Grade I diastolic dysfunction (impaired relaxation).   2. Right ventricular systolic function is normal. The right ventricular  size is normal. There is normal pulmonary artery systolic pressure. The  estimated right ventricular systolic pressure is 21.1 mmHg.   3. The mitral valve is normal in structure. Mild to moderate mitral valve  regurgitation. No evidence of mitral stenosis.   4. The aortic valve is tricuspid. Aortic valve regurgitation is mild to  moderate.   5. Frequent ectopy.   EKG:  EKG is ordered today and shows normal sinus rhythm, left anterior fascicular block (QRS is fairly broad at 124 ms, borderline for meeting full criteria for left bundle branch block).  His previous frequent PVCs are no longer seen.  Recent Labs: No results found for requested labs within last 365 days.  09/11/2021 Hemoglobin 12.4, creatinine 1.1, potassium 3.9, ALT 7.0, TSH 3.9 Recent Lipid Panel No results found for: "CHOL", "TRIG", "HDL", "CHOLHDL", "VLDL", "LDLCALC", "LDLDIRECT" 04/05/2023 Cholesterol 132, HDL 51, LDL 61, triglycerides 111  Risk Assessment/Calculations:            Physical Exam:    VS:  BP 130/68 (BP Location: Left Arm, Patient Position: Sitting, Cuff Size: Large)   Pulse 74   Ht 6\' 1"  (1.854 m)   Wt 252 lb 6.4 oz (114.5 kg)    SpO2 97%   BMI 33.30 kg/m     Wt Readings from Last 3 Encounters:  05/05/23 252 lb 6.4 oz (114.5 kg)  03/11/23 254 lb (115.2 kg)  12/25/22 258 lb (117 kg)      General: Alert, oriented x3, no distress, appears well Head: Opacified right cornea, EOMI, no exophtalmos or lid lag, no myxedema, no xanthelasma; normal ears, nose and oropharynx Neck: normal jugular venous pulsations and no hepatojugular reflux; brisk carotid pulses without delay and no carotid bruits Chest: clear to auscultation, no signs of consolidation by percussion or palpation, normal fremitus, symmetrical and full respiratory excursions Cardiovascular: normal position and quality of the apical impulse, regular rhythm, normal first and second heart sounds, faint late  systolic apical murmur, no diastolic murmurs, rubs or gallops Abdomen: no tenderness or distention, no masses by palpation, no abnormal pulsatility or arterial bruits, normal bowel sounds, no hepatosplenomegaly Extremities: no clubbing, cyanosis or edema; 2+ radial, ulnar and brachial pulses bilaterally; 2+ right femoral, posterior tibial and dorsalis pedis pulses; 2+ left femoral, posterior tibial and dorsalis pedis pulses; no subclavian or femoral bruits Neurological: grossly nonfocal Psych: Normal mood and affect    ASSESSMENT:    1. Coronary artery disease involving native coronary artery of native heart without angina pectoris   2. Dilated cardiomyopathy (HCC)   3. Frequent PVCs   4. IVCD (intraventricular conduction defect)      PLAN:    In order of problems listed above:  CAD: Asymptomatic.  He has never had angina, but the significance of CAD was suggested by the depressed EF and regional inferior wall hypokinesis. Now s/p RCA stent, but this was not associated with improvement in LVEF.  On aspirin, lipid-lowering therapy, beta-blocker. Cardiomyopathy appears out of proportion to the extent of the CAD.   Cardiomyopathy: Mildly depressed LVEF  confirmed by QGS, echo and LV angiography. In part, reduced LVEF may be explained by dyssynchrony related to his IVCD.  Asymptomatic, essentially NYHA functional class I (1 episode of dyspnea climbing the hill), euvolemic without diuretics. 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  Currently not on RAAS inhibitors.  Possibly had a rash from SGLT2 inhibitor.  Would like to reevaluate his EF now that his PVCs appear to have diminished in frequency.  If EF is better, he may have PVC-cardiomyopathy and we may not need to add more medications for heart failure, just focus on beta-blocker use for arrhythmia suppression. PVCs: PVCs may be an expression of the CMP or the cause of the CMP.  Amiodarone stopped after he developed a rash, although this could also have been secondary to the Jardiance. 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  Unlikely to benefit from CRT unless becomes broader (and of course if he develops clinical HF). Rash: Resolved after stopping Jardiance and amiodarone.      Medication Adjustments/Labs and Tests Ordered: Current medicines are reviewed at length with the patient today.  Concerns regarding medicines are outlined above.  Orders Placed This Encounter  Procedures   EKG 12-Lead   ECHOCARDIOGRAM COMPLETE   No orders of the defined types were placed in this encounter.   Patient Instructions  Medication Instructions:  No changes *If you need a refill on your cardiac medications before your next appointment, please call your pharmacy*  Testing/Procedures: Your physician has requested that you have an echocardiogram 03/2024 before 1 year follow up appointment. Echocardiography is a painless test that uses sound waves to create images of your heart. It provides your doctor with information about the size and shape of your heart and  how well your heart's chambers and valves are working. This procedure takes approximately one hour. There are no restrictions for this procedure. Please do NOT wear cologne, perfume, aftershave, or lotions (deodorant is allowed). Please arrive 15 minutes prior to your appointment time.    Follow-Up: At [facility], you and your health needs are our priority.  As part of our continuing mission to provide you with exceptional heart care, we have created designated Provider Care Teams.  These Care Teams include your primary Cardiologist (physician) and Advanced Practice Providers (  APPs -  Physician Assistants and Nurse Practitioners) who all work together to provide you with the care you need, when you need it.  We recommend signing up for the patient portal called "MyChart".  Sign up information is provided on this After Visit Summary.  MyChart is used to connect with patients for Virtual Visits (Telemedicine).  Patients are able to view lab/test results, encounter notes, upcoming appointments, etc.  Non-urgent messages can be sent to your provider as well.   To learn more about what you can do with MyChart, go to [email or web address].    Your next appointment:   1 year(s)  Provider:   Thurmon Fair, MD       Signed, Thurmon Fair, MD  05/09/2023 9:44 AM    [facility]

## 2023-05-14 ENCOUNTER — Encounter: Payer: Self-pay | Admitting: Physician Assistant

## 2023-05-14 ENCOUNTER — Ambulatory Visit: Payer: Medicare PPO | Admitting: Physician Assistant

## 2023-05-14 VITALS — BP 122/74 | HR 81 | Resp 20 | Ht 73.0 in | Wt 250.0 lb

## 2023-05-14 DIAGNOSIS — G500 Trigeminal neuralgia: Secondary | ICD-10-CM

## 2023-05-14 MED ORDER — BACLOFEN 5 MG PO TABS: 10.0000 mg | ORAL_TABLET | Freq: Two times a day (BID) | ORAL | 6 refills | Status: DC

## 2023-05-14 MED ORDER — GABAPENTIN 300 MG PO CAPS: 300.0000 mg | ORAL_CAPSULE | Freq: Three times a day (TID) | ORAL | 3 refills | Status: DC

## 2023-05-14 NOTE — Patient Instructions (Addendum)
  Continue gabapentin 900 mg 3 times a day   Baclofen 10 mg take  twice a day   Referral to [facility]  for trigeminal neuralgia  (Dr Tempie Donning) MRI trigeminal  3.5- 4 months

## 2023-05-14 NOTE — Progress Notes (Signed)
NEUROLOGY CONSULTATION NOTE  Vernard Rymer MRN: [id number] DOB: 02-03-1946   Referring provider: [name], MD  Primary care provider: [name], MD   Reason for consult:  facial and maxillary pain   Assessment/Plan:   Right facial pain, maxillary pain likely due to trigeminal neuralgia    This is a delightful 77 yo man with  15 month  history of unresolving right facial pain, following a V2 pattern, suspicious for trigeminal neuralgia.  Prior MRI of the brain was essentially unremarkable, with a dominant left vertebral artery as well as noted partial bilateral mastoid opacification.  MRA of the face to further evaluate the trigeminal nerve was negative for enhancement or masslike findings.  Of note, the right superior cerebellar artery trace versus next to the right trigeminal nerve was just beyond the root entry zone. There is no evidence of fracture or aggressive bone lesions.  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  He began treatment with gabapentin 100 mg 3 times daily, eventually increasing the doses. He is currently on Gabapentin 900 mg tid.  He was on Tegretol 200 mg twice daily, without reaching a therapeutic response.  Eventually, Tegretol was discontinued, and the patient was placed on baclofen 5 mg twice daily, then increase to 10 mg twice daily and titrating up for comfort.   He is able to perform activities of daily living without difficulty. Discussed with a colleague, Dr. Everlena Cooper, regarding possibly having him evaluated at [facility], at the trigeminal neuralgia clinic, for other options, treatment, opinion.  This patient has been treated since June 2023, without reading comfortable level. Recommendations  Continue gabapentin 900 mg 3 times daily. prescription sent to pharmacy. Side effects priorly discussed  Baclofen 10 mg bid, may increase for comfort if needed side effects discussed  MRI of the right trigeminal area Referral to [facility] for opinion on other  therapeutic options Follow-up in 3 to 4 months  Subjective:    Patient returns today in follow-up.  As recall, the patient was doing fairly well for quite some time, he states that for 2 months it was manageable, but when transitioning from Tegretol to baclofen, it was unbearable but "for the last 2 months it was manageable, until breaking a tooth on the left this last week, which has a small ulcer in the mucosa, all these affecting on the area of trigeminal neuralgia because he has to use the right side to chew, exacerbating the situation ".  "By this last Wednesday, even resting the head, will trigger the pain ".  "The sensation goes up to the eye socket and if I touch the indices over on the right, may trigger the symptoms ".Symptoms may appear when touching the mustache, that would elicit the pain "but not always".   Also, the pain is triggered by conversations, brushing, "from the right nose to the right eye " . He is alert, oriented, and does not demonstrate any memory deficiencies, he is very eloquent and able to demonstrate adequate, if not excellent memory.     Initial visit 02/06/2022 this is a very pleasant 77 year old man with a history of hypertension, hyperlipidemia, CAD status post stent placement, seen today for evaluation of R facial and maxillary pain. In review, in December, the patient was seen by a dentist due to tooth sensitivity, requiring dental work. At the time, he also reported sensitivity in the right eyebrow in a vertical line down to the R side of the nose and R nasolabial fold.   He  tried topical therapy that did not help.  Dentist suggested that they might be sinuses due to light pressure on the right maxillary area causing tenderness. Denies numbness or tingling, being able to reproduce the pain, "at one point, I felt the pain was coming from the neck". The R eyebrow pain is now "coming and going, about a 2 or 3". Maxillary CT was negative for abnormal findings. He tried  Flonase and chlorpheniramine for 6 weeks, only improving to 50 percent, but at times is severe. He denies any change in the sense of smell,  cold intolerance, head injury, pain with chewing. Initially the pain was extending to the right temple but this subsided. 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 No recent infection but his wife had shingles 1 months ago.  He was seen by ophthalmology as well, as 50 years ago he had eye trauma s/ scleral buckle becoming legally blind , but ophthalmic source was ruled out.        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  Normal  marrow signal, dominant left vertebral artery, other major intracranial flow voids are preserved.  MRI of the face with trigeminal view with and without contrast 02/23/2022 negative for mass or inflammation, right superior cerebellar artery transverses in close proximity to the right trigeminal nerve near the root entry zone.  PAST MEDICAL HISTORY: Past Medical History:  Diagnosis Date   Family history of breast cancer in male    Family history of pancreatic cancer    Legally blind in right eye, as defined in [place]     PAST SURGICAL HISTORY: Past Surgical History:  Procedure Laterality Date   CORONARY STENT INTERVENTION N/A 09/10/2021   Procedure: CORONARY STENT INTERVENTION;  Surgeon: Marykay Lex, MD;  Location: [facility] INVASIVE CV LAB;  Service: Cardiovascular;  Laterality: N/A;   LEFT HEART CATH AND CORONARY ANGIOGRAPHY N/A 09/10/2021   Procedure: LEFT HEART CATH AND CORONARY ANGIOGRAPHY;  Surgeon: Marykay Lex, MD;  Location: [facility] INVASIVE CV LAB;  Service: Cardiovascular;  Laterality: N/A;    MEDICATIONS: Current Outpatient Medications on File Prior to Visit  Medication Sig Dispense Refill   aspirin EC  81 MG tablet Take 81 mg by mouth daily. Swallow whole.     Baclofen 5 MG TABS Take 5 mg by mouth in the morning and at bedtime.     ezetimibe (ZETIA) 10 MG tablet      gabapentin (NEURONTIN) 300 MG capsule Take 300 mg by mouth 3 (three) times daily. Take three 300 mg capsules three times  daily     metoprolol succinate (TOPROL-XL) 50 MG 24 hr tablet TAKE 1 TABLET BY MOUTH EVERY DAY 90 tablet 3   nitroGLYCERIN (NITROSTAT) 0.4 MG SL tablet Place 1 tablet (0.4 mg total) under the tongue every 5 (five) minutes as needed for chest pain. (Patient not taking: Reported on 05/05/2023) 25 tablet 0   rosuvastatin (CRESTOR) 40 MG tablet TAKE 1 TABLET BY MOUTH EVERY DAY 90 tablet 2   No current facility-administered medications on file prior to visit.    ALLERGIES: Allergies  Allergen Reactions   Fluothane [Halothane] Nausea And Vomiting   Oxycodone Other (See Comments)    confusion   Oxycodone Hcl Other (See Comments)    FAMILY HISTORY: Family History  Problem Relation Age of Onset   Breast cancer Brother 71   Pancreatic cancer Cousin        pat first cousin   Pancreatic cancer Cousin        pat first cousin      Objective:    General: NAD, well-groomed  Head: Normocephalic  Eyes: Right eye legally blind after blunt trauma to the eye  Neck: supple, no paraspinal tenderness, full range of motion Back: No paraspinal tenderness Neurological Exam: Mental status: Alert and oriented to person, place and time, recent and remote memory intact, follows knowledge intact, attention and concentration intact, speech fluent and not dysarthric, language intact.   Cranial nerves: CN I: not tested CN II: L pupils equal, round and reactive to light, visual fields intact.  Right lens opaque CN III, IV, VI: Full ROM, no nystagmus, no proptosis.  He has decreased vision on the right (legally blind). CN V: Facial sensation intact no tenderness to palpation on the right eyebrow today, there is no painful  sensation in the right nasolabial area at the V2 region of the nerve as before. CN VII: upper and lower face symmetric CN VIII: Hearing is now intact CN IX, X: Intact gag, uvula midline CN XI: Sternocleidomastoid and trapezius muscles intact CN XII: Tongue midline.  Area inside the mouth, shows mucosal tenderness, especially at the frontal tooth and incisor area Bulk & Tone: Normal, no fasciculations  motor: Muscle strength 5 out of 5 throughout  sensation: Pinprick, temperature and vibratory sensation intact.  Gait: Normal station and stride.       Marlowe Kays, PA-C    Time spent: 30 minutes.

## 2023-05-19 ENCOUNTER — Other Ambulatory Visit: Payer: Self-pay | Admitting: Physician Assistant

## 2023-05-27 ENCOUNTER — Telehealth: Payer: Self-pay

## 2023-05-27 NOTE — Telephone Encounter (Signed)
I contacted [facility] and they have tried to contact patient to schedule for referral [phone]. He thanked me for calling.

## 2023-05-31 ENCOUNTER — Institutional Professional Consult (permissible substitution): Payer: Medicare PPO | Admitting: Primary Care

## 2023-06-07 ENCOUNTER — Encounter: Payer: Self-pay | Admitting: Nurse Practitioner

## 2023-06-07 ENCOUNTER — Ambulatory Visit: Payer: Medicare PPO | Admitting: Nurse Practitioner

## 2023-06-07 VITALS — BP 148/70 | HR 96 | Ht 73.0 in | Wt 250.6 lb

## 2023-06-07 DIAGNOSIS — E669 Obesity, unspecified: Secondary | ICD-10-CM

## 2023-06-07 DIAGNOSIS — R0681 Apnea, not elsewhere classified: Secondary | ICD-10-CM | POA: Insufficient documentation

## 2023-06-07 DIAGNOSIS — R0683 Snoring: Secondary | ICD-10-CM | POA: Diagnosis not present

## 2023-06-07 NOTE — Progress Notes (Signed)
@Patient  ID: Carlos Suarez, male    DOB: 1945-11-16, 77 y.o.   MRN: [id number]  Chief Complaint  Patient presents with   Consult    Pt is here for Sleep Consult. Sores and Stops Breathing.    Referring provider: [name], MD  HPI: 77 year old male, never smoker referred for sleep consult.  Past medical history significant for CAD, cardiomyopathy, frequent PVCs, trigeminal neuralgia, HLD.  TEST/EVENTS:   06/07/2023: Today-sleep consult Patient presents today for sleep consult, referred by Dr. Renne Crigler.  His wife is with him today who helps provide some of his history.  She has sleep apnea and is concerned that he also does. Loud snoring, witnessed apneas by his wife. He has had issues with reflux in the past. Has woken up gasping for air when these were happening but has not occurred more recently. Dry mouth during the night.  He is not sure if he is more tired during the day or if it is the current medications that he is on. He's recently been started on gabapentin and baclofen for trigeminal neuralgia.  Feels okay when he wakes up in the morning.  Does have some restlessness when he sleeps at night.  Denies any drowsy driving, morning headaches, sleep parasomnia/paralysis.  No history of narcolepsy or symptoms of cataplexy. He goes to bed between 10 PM and midnight.  Usually falls asleep relatively quickly.  They have an older dog who tends to wake him up at night; otherwise, he is usually up 1-2 times a night to use the restroom.  Gets up around 7 to 9 AM in the morning.  He is retired.  Weight is down 5 to 8 pounds over the last 2 years.  Never had a previous sleep study.  Does not currently wear CPAP or oxygen. He has a history of hypertension, PVCs and cardiomyopathy.  No history of stroke or diabetes.  He follows with cardiology.  He had a stent placed in January 2023. He is a never smoker.  Does not drink alcohol.  No excessive caffeine intake.  No sleep aids. Lives with his wife of  54 years.  Has adult children.  Family history of brother with asthma.  Epworth 2  Allergies  Allergen Reactions   Fluothane [Halothane] Nausea And Vomiting   Oxycodone Other (See Comments)    confusion   Oxycodone Hcl Other (See Comments)     There is no immunization history on file for this patient.  Past Medical History:  Diagnosis Date   Family history of breast cancer in male    Family history of pancreatic cancer    Legally blind in right eye, as defined in [place]     Tobacco History: Social History   Tobacco Use  Smoking Status Never  Smokeless Tobacco Never   Counseling given: Not Answered   Outpatient Medications Prior to Visit  Medication Sig Dispense Refill   aspirin EC 81 MG tablet Take 81 mg by mouth daily. Swallow whole.     Baclofen 5 MG TABS Take 2 tablets (10 mg total) by mouth in the morning and at bedtime. 180 tablet 6   ezetimibe (ZETIA) 10 MG tablet      gabapentin (NEURONTIN) 300 MG capsule TAKE 1 CAPSULE BY MOUTH 3 TIMES DAILY 270 capsule 3   metoprolol succinate (TOPROL-XL) 50 MG 24 hr tablet TAKE 1 TABLET BY MOUTH EVERY DAY 90 tablet 3   nitroGLYCERIN (NITROSTAT) 0.4 MG SL tablet Place 1 tablet (0.4 mg  total) under the tongue every 5 (five) minutes as needed for chest pain. 25 tablet 0   rosuvastatin (CRESTOR) 40 MG tablet TAKE 1 TABLET BY MOUTH EVERY DAY 90 tablet 2   No facility-administered medications prior to visit.     Review of Systems:   Constitutional: No night sweats, fevers, chills, or lassitude. +weight change, occasional daytime fatigue  HEENT: No headaches, difficulty swallowing, tooth/dental problems, or sore throat. No sneezing, itching, ear ache. +morning nasal congestion, dry mouth at night CV:  No chest pain, orthopnea, PND, swelling in lower extremities, anasarca, dizziness, palpitations, syncope Resp: +snoring, witnessed apneas; baseline shortness of breath with exertion. No excess mucus or change in color of mucus. No  productive or non-productive. No hemoptysis. No wheezing.  No chest wall deformity GI:  No heartburn, indigestion, abdominal pain, nausea, vomiting, diarrhea, change in bowel habits, loss of appetite, bloody stools.  GU: No nocturia Skin: No rash, lesions, ulcerations MSK:  No joint pain or swelling.   Neuro: No dizziness or lightheadedness.  Psych: No depression or anxiety. Mood stable. +sleep disturbance    Physical Exam:  BP (!) 148/70 (BP Location: Right Arm, Cuff Size: Large)   Pulse 96   Ht 6\' 1"  (1.854 m)   Wt 250 lb 9.6 oz (113.7 kg)   SpO2 96%   BMI 33.06 kg/m   GEN: Pleasant, interactive, well-appearing; obese; in no acute distress HEENT:  Normocephalic and atraumatic. PERRLA. Sclera white. Nasal turbinates pink, moist and patent bilaterally. No rhinorrhea present. Oropharynx pink and moist, without exudate or edema. No lesions, ulcerations, or postnasal drip. Mallampati II NECK:  Supple w/ fair ROM. No JVD present. Thyroid symmetrical with no goiter or nodules palpated. No lymphadenopathy.   CV: RRR, no m/r/g, no peripheral edema. Pulses intact, +2 bilaterally. No cyanosis, pallor or clubbing. PULMONARY:  Unlabored, regular breathing. Clear bilaterally A&P w/o wheezes/rales/rhonchi. No accessory muscle use.  GI: BS present and normoactive. Soft, non-tender to palpation. No organomegaly or masses detected. MSK: No erythema, warmth or tenderness. Cap refil <2 sec all extrem. No deformities or joint swelling noted.  Neuro: A/Ox3. No focal deficits noted.   Skin: Warm, no lesions or rashe Psych: Normal affect and behavior. Judgement and thought content appropriate.     Lab Results:  CBC    Component Value Date/Time   WBC 6.5 09/11/2021 0200   RBC 4.07 (L) 09/11/2021 0200   HGB 12.4 (L) 09/11/2021 0200   HGB 13.8 09/02/2021 1233   HCT 38.2 (L) 09/11/2021 0200   HCT 42.7 09/02/2021 1233   PLT 141 (L) 09/11/2021 0200   PLT 161 09/02/2021 1233   MCV 93.9 09/11/2021  0200   MCV 93 09/02/2021 1233   MCH 30.5 09/11/2021 0200   MCHC 32.5 09/11/2021 0200   RDW 12.4 09/11/2021 0200   RDW 12.2 09/02/2021 1233    BMET    Component Value Date/Time   NA 138 09/11/2021 0200   NA 142 09/02/2021 1233   K 3.9 09/11/2021 0200   CL 107 09/11/2021 0200   CO2 22 09/11/2021 0200   GLUCOSE 93 09/11/2021 0200   BUN 12 09/11/2021 0200   BUN 17 09/02/2021 1233   CREATININE 1.11 09/11/2021 0200   CALCIUM 8.7 (L) 09/11/2021 0200   GFRNONAA >60 09/11/2021 0200    BNP No results found for: "BNP"   Imaging:  No results found.  Administration History     None           No  data to display          No results found for: "NITRICOXIDE"      Assessment & Plan:   Loud snoring He has snoring, daytime sleepiness, nocturnal apneic events. BMI 33. History of HTN, cardiomyopathy, PVCs. Given this,  I am concerned he could have sleep disordered breathing with obstructive sleep apnea. He will need sleep study for further evaluation.    - discussed how weight can impact sleep and risk for sleep disordered breathing - discussed options to assist with weight loss: combination of diet modification, cardiovascular and strength training exercises   - had an extensive discussion regarding the adverse health consequences related to untreated sleep disordered breathing - specifically discussed the risks for hypertension, coronary artery disease, cardiac dysrhythmias, cerebrovascular disease, and diabetes - lifestyle modification discussed   - discussed how sleep disruption can increase risk of accidents, particularly when driving - safe driving practices were discussed  Patient Instructions  Given your symptoms, I am concerned that you may have sleep disordered breathing with sleep apnea. You will need a sleep study for further evaluation. Someone will contact you to schedule this.   We discussed how untreated sleep apnea puts an individual at risk for  cardiac arrhthymias, pulm HTN, DM, stroke and increases their risk for daytime accidents. We also briefly reviewed treatment options including weight loss, side sleeping position, oral appliance, CPAP therapy or referral to ENT for possible surgical options  Use caution when driving and pull over if you become sleepy.  Follow up in 6-8 weeks with Florentina [name] Corinthian Kemler,NP to go over sleep study results, or sooner, if needed    Witnessed apneic spells See above  Obesity (BMI 30-39.9) BMI 33. Healthy weight loss encouraged   I spent 35 minutes of dedicated to the care of this patient on the date of this encounter to include pre-visit review of records, face-to-face time with the patient discussing conditions above, post visit ordering of testing, clinical documentation with the electronic health record, making appropriate referrals as documented, and communicating necessary findings to members of the patients care team.  Noemi Chapel, NP 06/07/2023  Pt aware and understands NP's role.

## 2023-06-07 NOTE — Assessment & Plan Note (Signed)
 BMI 33. Healthy weight loss encouraged.

## 2023-06-07 NOTE — Assessment & Plan Note (Signed)
He has snoring, daytime sleepiness, nocturnal apneic events. BMI 33. History of HTN, cardiomyopathy, PVCs. Given this,  I am concerned he could have sleep disordered breathing with obstructive sleep apnea. He will need sleep study for further evaluation.    - discussed how weight can impact sleep and risk for sleep disordered breathing - discussed options to assist with weight loss: combination of diet modification, cardiovascular and strength training exercises   - had an extensive discussion regarding the adverse health consequences related to untreated sleep disordered breathing - specifically discussed the risks for hypertension, coronary artery disease, cardiac dysrhythmias, cerebrovascular disease, and diabetes - lifestyle modification discussed   - discussed how sleep disruption can increase risk of accidents, particularly when driving - safe driving practices were discussed  Patient Instructions  Given your symptoms, I am concerned that you may have sleep disordered breathing with sleep apnea. You will need a sleep study for further evaluation. Someone will contact you to schedule this.   We discussed how untreated sleep apnea puts an individual at risk for cardiac arrhthymias, pulm HTN, DM, stroke and increases their risk for daytime accidents. We also briefly reviewed treatment options including weight loss, side sleeping position, oral appliance, CPAP therapy or referral to ENT for possible surgical options  Use caution when driving and pull over if you become sleepy.  Follow up in 6-8 weeks with Katie Autie Vasudevan,NP to go over sleep study results, or sooner, if needed

## 2023-06-07 NOTE — Assessment & Plan Note (Signed)
See above

## 2023-06-07 NOTE — Patient Instructions (Signed)
Given your symptoms, I am concerned that you may have sleep disordered breathing with sleep apnea. You will need a sleep study for further evaluation. Someone will contact you to schedule this.   We discussed how untreated sleep apnea puts an individual at risk for cardiac arrhthymias, pulm HTN, DM, stroke and increases their risk for daytime accidents. We also briefly reviewed treatment options including weight loss, side sleeping position, oral appliance, CPAP therapy or referral to ENT for possible surgical options  Use caution when driving and pull over if you become sleepy.  Follow up in 6-8 weeks with Katie Gerri Acre,NP to go over sleep study results, or sooner, if needed

## 2023-06-12 ENCOUNTER — Encounter (INDEPENDENT_AMBULATORY_CARE_PROVIDER_SITE_OTHER): Payer: Medicare PPO

## 2023-06-12 DIAGNOSIS — G4730 Sleep apnea, unspecified: Secondary | ICD-10-CM | POA: Diagnosis not present

## 2023-06-12 DIAGNOSIS — R0683 Snoring: Secondary | ICD-10-CM

## 2023-06-21 ENCOUNTER — Encounter: Payer: Self-pay | Admitting: Physician Assistant

## 2023-06-24 ENCOUNTER — Ambulatory Visit
Admission: RE | Admit: 2023-06-24 | Discharge: 2023-06-24 | Disposition: A | Discharge status: Home / Self Care | Payer: Medicare PPO | Source: Ambulatory Visit | Attending: Physician Assistant | Admitting: Physician Assistant

## 2023-06-24 DIAGNOSIS — G500 Trigeminal neuralgia: Secondary | ICD-10-CM | POA: Diagnosis not present

## 2023-06-24 MED ORDER — GADOPICLENOL 0.5 MMOL/ML IV SOLN
10.0000 mL | Freq: Once | INTRAVENOUS | Status: AC | PRN
  Administered 2023-06-24: 10 mL via INTRAVENOUS

## 2023-06-28 DIAGNOSIS — G500 Trigeminal neuralgia: Secondary | ICD-10-CM | POA: Diagnosis not present

## 2023-07-09 ENCOUNTER — Telehealth: Payer: Self-pay | Admitting: Pulmonary Disease

## 2023-07-09 DIAGNOSIS — G4733 Obstructive sleep apnea (adult) (pediatric): Secondary | ICD-10-CM | POA: Diagnosis not present

## 2023-07-09 NOTE — Telephone Encounter (Signed)
Call patient  Sleep study result  Date of study: 06/12/2023  Impression: Severe obstructive sleep apnea Severe oxygen desaturations with O2 nadir of 59%  Recommendation:  Recommend in-lab titration study in the context of very severe disease with severe oxygen desaturations, underlying heart disease  Close clinical follow-up for optimization of treatment

## 2023-07-15 ENCOUNTER — Telehealth: Payer: Self-pay | Admitting: Physician Assistant

## 2023-07-15 ENCOUNTER — Other Ambulatory Visit: Payer: Self-pay | Admitting: Physician Assistant

## 2023-07-15 MED ORDER — BACLOFEN 5 MG PO TABS: ORAL_TABLET | ORAL | 6 refills | Status: DC

## 2023-07-15 NOTE — Telephone Encounter (Signed)
Pt called in stating the pharmacy can't refill his Baclofen due to the dosage. He says he was supposed to start with 5 mg and work up to 10 mg twice a day. With the way the prescription is written it looks like its not time to get more. He needs an updated prescription sent in to [facility] [address] [place]  He has enough through tomorrow, but will be out Saturday

## 2023-07-16 ENCOUNTER — Other Ambulatory Visit: Payer: Self-pay | Admitting: Physician Assistant

## 2023-07-16 MED ORDER — BACLOFEN 10 MG PO TABS: 10.0000 mg | ORAL_TABLET | Freq: Two times a day (BID) | ORAL | 6 refills | Status: DC

## 2023-07-20 NOTE — Progress Notes (Signed)
MRI face similar to prior, no acute findings, no tumors, circulation is good. Thanks

## 2023-07-23 ENCOUNTER — Encounter: Payer: Self-pay | Admitting: Nurse Practitioner

## 2023-07-23 ENCOUNTER — Telehealth: Payer: Medicare PPO | Admitting: Nurse Practitioner

## 2023-07-23 DIAGNOSIS — G4739 Other sleep apnea: Secondary | ICD-10-CM | POA: Diagnosis not present

## 2023-07-23 DIAGNOSIS — E669 Obesity, unspecified: Secondary | ICD-10-CM | POA: Diagnosis not present

## 2023-07-23 NOTE — Patient Instructions (Addendum)
Start CPAP 5-20 cmH2O, mask of choice and heated humidity every night, minimum of 4-6 hours a night.  Change equipment as directed. Wash your tubing with warm soap and water daily, hang to dry. Wash humidifier portion weekly. Use bottled, distilled water and change daily Be aware of reduced alertness and do not drive or operate heavy machinery if experiencing this or drowsiness.  Exercise encouraged, as tolerated. Healthy weight management discussed.  Avoid or decrease alcohol consumption and medications that make you more sleepy, if possible. Notify if persistent daytime sleepiness occurs even with consistent use of PAP therapy.  Change supplies.... Every month Mask cushions and/or nasal pillows CPAP machine filters Every 3 months Mask frame (not including the headgear) CPAP tubing Every 6 months Mask headgear Chin strap (if applicable) Humidifier water tub  We discussed how untreated sleep apnea puts an individual at risk for cardiac arrhthymias, pulm HTN, DM, stroke and increases their risk for daytime accidents. We also briefly reviewed treatment options including weight loss, side sleeping position, oral appliance, CPAP therapy or referral to ENT for possible surgical options  Given severity of sleep apnea, central events, and low oxygen levels, you need to also have a CPAP titration study. Someone will contact you to schedule this. If you have not heard from them in the next 1-2 weeks, please call me.   Follow up in 6 weeks after in lab study with Dr. Wynona Neat or Philis Nettle. If symptoms do not improve or worsen, please contact office for sooner follow up or seek emergency care.

## 2023-07-23 NOTE — Assessment & Plan Note (Signed)
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  Reviewed risks of untreated severe sleep apnea and potential treatment options.  Discussed validity of test.  He did complete 3 nights of home sleep studies.  On the study documented, he was analyzed for 459 minutes.  Discussed that this is enough data to accurately diagnose his sleep apnea.  Shared decision to move forward with CPAP therapy.  Orders placed for urgent new start CPAP 5-20 cmH2O, mask of choice and heated humidity.  He will also need an in lab CPAP titration study to ensure hypoxia is corrected and that he does not have worsening central events with central emergence.  Orders placed today.  Advised him to contact us in the next 1 to 2 weeks if he has not heard anything about scheduling his sleep study or has not received his CPAP.  Reviewed proper care/use of device.  Healthy weight loss encouraged.  Aware of safe driving practices.  Risks/benefits reviewed.  Patient Instructions  Start CPAP 5-20 cmH2O, mask of choice and heated humidity every night, minimum of 4-6 hours a night.  Change equipment as directed. Wash your tubing with warm soap and water daily, hang to dry. Wash humidifier portion weekly. Use bottled, distilled water and change daily Be aware of reduced alertness and do not drive or operate heavy machinery if experiencing this or drowsiness.  Exercise encouraged, as tolerated. Healthy weight management discussed.  Avoid or decrease alcohol consumption and medications that make you more sleepy, if possible. Notify if persistent daytime sleepiness occurs even with consistent use of PAP therapy.  Change supplies.... Every month Mask cushions and/or nasal pillows CPAP machine filters Every 3 months Mask frame (not including the headgear) CPAP tubing Every 6 months Mask headgear Chin strap (if applicable) Humidifier water tub  We discussed how untreated sleep apnea puts an individual at  risk for cardiac arrhthymias, pulm HTN, DM, stroke and increases their risk for daytime accidents. We also briefly reviewed treatment options including weight loss, side sleeping position, oral appliance, CPAP therapy or referral to ENT for possible surgical options  Given severity of sleep apnea, central events, and low oxygen levels, you need to also have a CPAP titration study. Someone will contact you to schedule this. If you have not heard from them in the next 1-2 weeks, please call me.   Follow up in 6 weeks after in lab study with Dr. Wynona Neat or Philis Nettle. If symptoms do not improve or worsen, please contact office for sooner follow up or seek emergency care.

## 2023-07-23 NOTE — Progress Notes (Signed)
Patient ID: Carlos Suarez, male     DOB: November 11, 1945, 77 y.o.      MRN: [id number]  Chief Complaint  Patient presents with   Follow-up    HST results    Virtual Visit via Video Note  I connected with Carlos Suarez on 07/23/23 at  2:30 PM EST by a video enabled telemedicine application and verified that I am speaking with the correct person using two identifiers.  Location: Patient: Home Provider: Office   I discussed the limitations of evaluation and management by telemedicine and the availability of in person appointments. The patient expressed understanding and agreed to proceed.  History of Present Illness: 77 year old male, never smoker followed for severe OSA.  Past medical history significant for CAD, cardiomyopathy, frequent PVCs, trigeminal neuralgia, HLD.   TEST/EVENTS:  06/12/2023 HST: AHI 55/h, CAI 17.5 (32%), SpO2 low 59%  06/07/2023: OV with Keelie Zemanek NP for sleep consult, referred by Dr. Renne Crigler.  His wife is with him today who helps provide some of his history.  She has sleep apnea and is concerned that he also does. Loud snoring, witnessed apneas by his wife. He has had issues with reflux in the past. Has woken up gasping for air when these were happening but has not occurred more recently. Dry mouth during the night.  He is not sure if he is more tired during the day or if it is the current medications that he is on. He's recently been started on gabapentin and baclofen for trigeminal neuralgia.  Feels okay when he wakes up in the morning.  Does have some restlessness when he sleeps at night.  Denies any drowsy driving, morning headaches, sleep parasomnia/paralysis.  No history of narcolepsy or symptoms of cataplexy. He goes to bed between 10 PM and midnight.  Usually falls asleep relatively quickly.  They have an older dog who tends to wake him up at night; otherwise, he is usually up 1-2 times a night to use the restroom.  Gets up around 7 to 9 AM in the morning.  He is  retired.  Weight is down 5 to 8 pounds over the last 2 years.  Never had a previous sleep study.  Does not currently wear CPAP or oxygen. He has a history of hypertension, PVCs and cardiomyopathy.  No history of stroke or diabetes.  He follows with cardiology.  He had a stent placed in January 2023. He is a never smoker.  Does not drink alcohol.  No excessive caffeine intake.  No sleep aids. Lives with his wife of 54 years.  Has adult children.  Family history of brother with asthma. [place] 2  07/23/2023: Today - follow up Patient presents via video visit with his wife for follow-up to discuss sleep study results which revealed severe mixed obstructive and central sleep apnea.  His central events made up 32% of his overall apneic events.  He feels unchanged compared to his last visit.  He did have some issues the third night of his study where the cannula and finger probe both came off.  He was not sure if this affected the results of his study.  He does not have any issues with drowsy driving, morning headaches or sleep parasomnia/paralysis.  Wants to discuss treatment options today.  Allergies  Allergen Reactions   Fluothane [Halothane] Nausea And Vomiting   Oxycodone Other (See Comments)    confusion   Oxycodone Hcl Other (See Comments)    There is no immunization history on  file for this patient. Past Medical History:  Diagnosis Date   Family history of breast cancer in male    Family history of pancreatic cancer    Legally blind in right eye, as defined in [place]     Tobacco History: Social History   Tobacco Use  Smoking Status Never  Smokeless Tobacco Never   Counseling given: Not Answered   Outpatient Medications Prior to Visit  Medication Sig Dispense Refill   aspirin EC 81 MG tablet Take 81 mg by mouth daily. Swallow whole.     baclofen (LIORESAL) 10 MG tablet Take 1 tablet (10 mg total) by mouth 2 (two) times daily. 30 each 6   ezetimibe (ZETIA) 10 MG tablet       gabapentin (NEURONTIN) 300 MG capsule TAKE 1 CAPSULE BY MOUTH 3 TIMES DAILY 270 capsule 3   metoprolol succinate (TOPROL-XL) 50 MG 24 hr tablet TAKE 1 TABLET BY MOUTH EVERY DAY 90 tablet 3   nitroGLYCERIN (NITROSTAT) 0.4 MG SL tablet Place 1 tablet (0.4 mg total) under the tongue every 5 (five) minutes as needed for chest pain. 25 tablet 0   rosuvastatin (CRESTOR) 40 MG tablet TAKE 1 TABLET BY MOUTH EVERY DAY 90 tablet 2   No facility-administered medications prior to visit.     Review of Systems:   Constitutional: No night sweats, fevers, chills, or lassitude. +weight change, occasional daytime fatigue  HEENT: No headaches, difficulty swallowing, tooth/dental problems, or sore throat. No sneezing, itching, ear ache. +morning nasal congestion, dry mouth at night CV:  No chest pain, orthopnea, PND, swelling in lower extremities, anasarca, dizziness, palpitations, syncope Resp: +snoring, witnessed apneas; baseline shortness of breath with exertion. No excess mucus or change in color of mucus. No productive or non-productive. No hemoptysis. No wheezing.  No chest wall deformity GI:  No heartburn, indigestion, abdominal pain, nausea, vomiting, diarrhea, change in bowel habits, loss of appetite, bloody stools.  GU: No nocturia Skin: No rash, lesions, ulcerations MSK:  No joint pain or swelling.   Neuro: No dizziness or lightheadedness.  Psych: No depression or anxiety. Mood stable. +sleep disturbance  Observations/Objective: Patient is well-developed, well-nourished in no acute distress. A&Ox3. Resting comfortably at home. Unlabored breathing. Speech is clear and coherent with logical content.    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  Reviewed risks of untreated severe sleep apnea and potential treatment options.  Discussed validity of test.  He did complete 3 nights of home sleep studies.  On the study  documented, he was analyzed for 459 minutes.  Discussed that this is enough data to accurately diagnose his sleep apnea.  Shared decision to move forward with CPAP therapy.  Orders placed for urgent new start CPAP 5-20 cmH2O, mask of choice and heated humidity.  He will also need an in lab CPAP titration study to ensure hypoxia is corrected and that he does not have worsening central events with central emergence.  Orders placed today.  Advised him to contact us in the next 1 to 2 weeks if he has not heard anything about scheduling his sleep study or has not received his CPAP.  Reviewed proper care/use of device.  Healthy weight loss encouraged.  Aware of safe driving practices.  Risks/benefits reviewed.  Patient Instructions  Start CPAP 5-20 cmH2O, mask of choice and heated humidity every night, minimum of 4-6 hours a night.  Change equipment as directed. Wash your tubing with warm soap  and water daily, hang to dry. Wash humidifier portion weekly. Use bottled, distilled water and change daily Be aware of reduced alertness and do not drive or operate heavy machinery if experiencing this or drowsiness.  Exercise encouraged, as tolerated. Healthy weight management discussed.  Avoid or decrease alcohol consumption and medications that make you more sleepy, if possible. Notify if persistent daytime sleepiness occurs even with consistent use of PAP therapy.  Change supplies.... Every month Mask cushions and/or nasal pillows CPAP machine filters Every 3 months Mask frame (not including the headgear) CPAP tubing Every 6 months Mask headgear Chin strap (if applicable) Humidifier water tub  We discussed how untreated sleep apnea puts an individual at risk for cardiac arrhthymias, pulm HTN, DM, stroke and increases their risk for daytime accidents. We also briefly reviewed treatment options including weight loss, side sleeping position, oral appliance, CPAP therapy or referral to ENT for possible  surgical options  Given severity of sleep apnea, central events, and low oxygen levels, you need to also have a CPAP titration study. Someone will contact you to schedule this. If you have not heard from them in the next 1-2 weeks, please call me.   Follow up in 6 weeks after in lab study with Dr. Wynona Neat or Philis Nettle. If symptoms do not improve or worsen, please contact office for sooner follow up or seek emergency care.    Obesity (BMI 30-39.9) Reviewed correlation between obesity and sleep apnea.  Healthy weight loss encouraged.     I discussed the assessment and treatment plan with the patient. The patient was provided an opportunity to ask questions and all were answered. The patient agreed with the plan and demonstrated an understanding of the instructions.   The patient was advised to call back or seek an in-person evaluation if the symptoms worsen or if the condition fails to improve as anticipated.  I provided 35 minutes of non-face-to-face time during this encounter.   Noemi Chapel, NP

## 2023-07-23 NOTE — Assessment & Plan Note (Signed)
Reviewed correlation between obesity and sleep apnea.  Healthy weight loss encouraged.

## 2023-07-24 ENCOUNTER — Other Ambulatory Visit: Payer: Self-pay | Admitting: Cardiovascular Disease

## 2023-07-28 ENCOUNTER — Telehealth: Payer: Self-pay | Admitting: Nurse Practitioner

## 2023-07-28 NOTE — Telephone Encounter (Signed)
Need to be sch for CPAP Titration

## 2023-07-29 ENCOUNTER — Other Ambulatory Visit: Payer: Self-pay | Admitting: Cardiovascular Disease

## 2023-08-02 ENCOUNTER — Ambulatory Visit: Payer: Medicare PPO | Admitting: Nurse Practitioner

## 2023-08-06 DIAGNOSIS — G4733 Obstructive sleep apnea (adult) (pediatric): Secondary | ICD-10-CM | POA: Diagnosis not present

## 2023-08-17 ENCOUNTER — Ambulatory Visit: Payer: Medicare PPO | Admitting: Physician Assistant

## 2023-08-21 NOTE — Progress Notes (Signed)
NEUROLOGY CONSULTATION NOTE  Carlos Suarez MRN: [id number] DOB: 1946/02/22   Referring provider: [name], MD  Primary care provider: [name], MD   Reason for consult:  facial and maxillary pain   Assessment/Plan:   Right facial pain, maxillary pain likely due to trigeminal neuralgia    This is a delightful 77 yo man with  15 month  history of unresolving right facial pain, following a V2 pattern, suspicious for trigeminal neuralgia.  Prior MRI of the brain was essentially unremarkable, with a dominant left vertebral artery as well as noted partial bilateral mastoid opacification.  MRA of the face to further evaluate the trigeminal nerve was negative for enhancement or masslike findings.  Of note, the right superior cerebellar artery trace versus next to the right trigeminal nerve was just beyond the root entry zone. There is no evidence of fracture or aggressive bone lesions.  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  He began treatment with gabapentin 100 mg 3 times daily, eventually increasing the doses. He is currently on Gabapentin 900 mg tid.  He was on Tegretol 200 mg twice daily, without reaching a therapeutic response.  Eventually, Tegretol was discontinued, and the patient was placed on baclofen 5 mg twice daily, then increase to 10 mg twice daily and titrating up for comfort.   He is able to perform activities of daily living without difficulty. Discussed with a colleague, Carlos Suarez, regarding possibly having him evaluated at [facility], at the trigeminal neuralgia clinic, for other options, treatment, opinion.  This patient has been treated since June 2023, without reading comfortable level.  (NEURONTIN) 300 mg capsule Take 900 mg by mouth 3 (three) times a day.  baclofen 5 mg twice a day   "Trigeminal neuralgia: Patient is currently being treated by neurology at [facility] he has tried and failed Tegretol. Currently on a high dose of gabapentin and baclofen with  suboptimal results. I believe that his cervical spine could possibly be contributing to his symptoms. He had a MRI of his trigeminal nerve a year ago that showed degenerative facet spurring on the right at C3-4 with advanced right foraminal stenosis. I have given the patient options of further exploring his neck with an MRI of the cervical spine or referral to [facility] to be evaluated for gamma knife. Patient reports that he is going to discuss this with his PCP and neurologist. Recommend he follow-up as needed."  Recommendations  Continue gabapentin 900 mg 3 times daily. prescription sent to pharmacy. Side effects priorly discussed  Baclofen 10 mg bid, ***may increase for comfort if needed side effects discussed  Follow-up in 3 to 4 months  Subjective:    Patient returns today in follow-up.  As recall, the patient was doing fairly well for quite some time, he states that for 2 months it was manageable, but when transitioning from Tegretol to baclofen, it was unbearable but "for the last 2 months it was manageable, until breaking a tooth on the left this last week, which has a small ulcer in the mucosa, all these affecting on the area of trigeminal neuralgia because he has to use the right side to chew, exacerbating the situation ".  "By this last Wednesday, even resting the head, will trigger the pain ".  "The sensation goes up to the eye socket and if I touch the indices over on the right, may trigger the symptoms ".Symptoms may appear when touching the mustache, that would elicit the pain "but not  always".   Also, the pain is triggered by conversations, brushing, "from the right nose to the right eye " . He is alert, oriented, and does not demonstrate any memory deficiencies, he is very eloquent and able to demonstrate adequate, if not excellent memory.     Initial visit 02/06/2022 this is a very pleasant 77 year old man with a history of hypertension, hyperlipidemia, CAD status post  stent placement, seen today for evaluation of R facial and maxillary pain. In review, in December, the patient was seen by a dentist due to tooth sensitivity, requiring dental work. At the time, he also reported sensitivity in the right eyebrow in a vertical line down to the R side of the nose and R nasolabial fold.   He tried topical therapy that did not help.  Dentist suggested that they might be sinuses due to light pressure on the right maxillary area causing tenderness. Denies numbness or tingling, being able to reproduce the pain, "at one point, I felt the pain was coming from the neck". The R eyebrow pain is now "coming and going, about a 2 or 3". Maxillary CT was negative for abnormal findings. He tried Flonase and chlorpheniramine for 6 weeks, only improving to 50 percent, but at times is severe. He denies any change in the sense of smell,  cold intolerance, head injury, pain with chewing. Initially the pain was extending to the right temple but this subsided. 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 No recent infection but his wife had shingles 1 months ago.  He was seen by ophthalmology as well, as 50 years ago he had eye trauma s/ scleral buckle becoming legally blind , but ophthalmic source was ruled out.        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  Normal  marrow signal, dominant left vertebral artery, other major intracranial flow voids are preserved.  MRI of the face with trigeminal view with and without contrast 02/23/2022 negative for mass or inflammation, right superior cerebellar artery transverses in close proximity to the right trigeminal nerve near the root entry zone.  PAST MEDICAL HISTORY: Past Medical History:  Diagnosis Date   Family history of breast  cancer in male    Family history of pancreatic cancer    Legally blind in right eye, as defined in [place]     PAST SURGICAL HISTORY: Past Surgical History:  Procedure Laterality Date   CORONARY STENT INTERVENTION N/A 09/10/2021   Procedure: CORONARY STENT INTERVENTION;  Surgeon: Marykay Lex, MD;  Location: [facility] INVASIVE CV LAB;  Service: Cardiovascular;  Laterality: N/A;   LEFT HEART CATH AND CORONARY ANGIOGRAPHY N/A 09/10/2021   Procedure: LEFT HEART CATH AND CORONARY ANGIOGRAPHY;  Surgeon: Marykay Lex, MD;  [facility] INVASIVE CV LAB;  Service: Cardiovascular;  Laterality: N/A;    MEDICATIONS: Current Outpatient Medications on File Prior to Visit  Medication Sig Dispense Refill   aspirin EC 81 MG tablet Take 81 mg by mouth daily. Swallow whole.     baclofen (LIORESAL) 10 MG tablet Take 1 tablet (10 mg total) by mouth 2 (two) times daily. 30 each 6   ezetimibe (ZETIA) 10 MG tablet      gabapentin (NEURONTIN) 300 MG capsule TAKE 1 CAPSULE BY MOUTH 3 TIMES DAILY 270 capsule 3  metoprolol succinate (TOPROL-XL) 50 MG 24 hr tablet TAKE 1 TABLET BY MOUTH EVERY DAY 90 tablet 2   nitroGLYCERIN (NITROSTAT) 0.4 MG SL tablet Place 1 tablet (0.4 mg total) under the tongue every 5 (five) minutes as needed for chest pain. 25 tablet 0   rosuvastatin (CRESTOR) 40 MG tablet TAKE 1 TABLET BY MOUTH EVERY DAY 90 tablet 2   No current facility-administered medications on file prior to visit.    ALLERGIES: Allergies  Allergen Reactions   Fluothane [Halothane] Nausea And Vomiting   Oxycodone Other (See Comments)    confusion   Oxycodone Hcl Other (See Comments)    FAMILY HISTORY: Family History  Problem Relation Age of Onset   Breast cancer Brother 83   Pancreatic cancer Cousin        pat first cousin   Pancreatic cancer Cousin        pat first cousin      Objective:    General: NAD, well-groomed  Head: Normocephalic  Eyes: Right eye legally blind after blunt trauma to the eye   Neck: supple, no paraspinal tenderness, full range of motion Back: No paraspinal tenderness Neurological Exam: Mental status: Alert and oriented to person, place and time, recent and remote memory intact, follows knowledge intact, attention and concentration intact, speech fluent and not dysarthric, language intact.   Cranial nerves: CN I: not tested CN II: L pupils equal, round and reactive to light, visual fields intact.  Right lens opaque CN III, IV, VI: Full ROM, no nystagmus, no proptosis.  He has decreased vision on the right (legally blind). CN V: Facial sensation intact no tenderness to palpation on the right eyebrow today, there is no painful sensation in the right nasolabial area at the V2 region of the nerve as before. CN VII: upper and lower face symmetric CN VIII: Hearing is now intact CN IX, X: Intact gag, uvula midline CN XI: Sternocleidomastoid and trapezius muscles intact CN XII: Tongue midline.  Area inside the mouth, shows mucosal tenderness, especially at the frontal tooth and incisor area Bulk & Tone: Normal, no fasciculations  motor: Muscle strength 5 out of 5 throughout  sensation: Pinprick, temperature and vibratory sensation intact.  Gait: Normal station and stride.       Marlowe Kays, PA-C    Time spent: 30 minutes.

## 2023-08-27 ENCOUNTER — Encounter: Payer: Self-pay | Admitting: Physician Assistant

## 2023-08-27 ENCOUNTER — Ambulatory Visit: Payer: Medicare PPO | Admitting: Physician Assistant

## 2023-08-27 VITALS — BP 128/80 | HR 78 | Ht 73.0 in | Wt 257.0 lb

## 2023-08-27 DIAGNOSIS — G500 Trigeminal neuralgia: Secondary | ICD-10-CM | POA: Diagnosis not present

## 2023-08-27 MED ORDER — BACLOFEN 10 MG PO TABS: 10.0000 mg | ORAL_TABLET | Freq: Two times a day (BID) | ORAL | 6 refills | Status: DC

## 2023-08-27 NOTE — Patient Instructions (Signed)
  Continue gabapentin 900 mg 3 times a day   Baclofen 10 mg take  twice a day   Request visit to  [facility]  for trigeminal neuralgia  (Dr Tempie Donning) 3.5- 4 months

## 2023-09-06 ENCOUNTER — Ambulatory Visit: Payer: Medicare PPO | Admitting: Nurse Practitioner

## 2023-09-06 DIAGNOSIS — G4733 Obstructive sleep apnea (adult) (pediatric): Secondary | ICD-10-CM | POA: Diagnosis not present

## 2023-09-14 ENCOUNTER — Encounter (HOSPITAL_BASED_OUTPATIENT_CLINIC_OR_DEPARTMENT_OTHER): Payer: Medicare PPO | Admitting: Pulmonary Disease

## 2023-09-15 ENCOUNTER — Emergency Department (HOSPITAL_BASED_OUTPATIENT_CLINIC_OR_DEPARTMENT_OTHER)
Admission: EM | Admit: 2023-09-15 | Discharge: 2023-09-15 | Disposition: A | Discharge status: Home / Self Care | Payer: Medicare PPO | Attending: Emergency Medicine | Admitting: Emergency Medicine

## 2023-09-15 ENCOUNTER — Other Ambulatory Visit: Payer: Self-pay

## 2023-09-15 ENCOUNTER — Encounter (HOSPITAL_BASED_OUTPATIENT_CLINIC_OR_DEPARTMENT_OTHER): Payer: Self-pay | Admitting: *Deleted

## 2023-09-15 ENCOUNTER — Emergency Department (HOSPITAL_BASED_OUTPATIENT_CLINIC_OR_DEPARTMENT_OTHER): Payer: Medicare PPO | Admitting: Radiology

## 2023-09-15 DIAGNOSIS — R911 Solitary pulmonary nodule: Secondary | ICD-10-CM | POA: Diagnosis not present

## 2023-09-15 DIAGNOSIS — I2510 Atherosclerotic heart disease of native coronary artery without angina pectoris: Secondary | ICD-10-CM | POA: Diagnosis not present

## 2023-09-15 DIAGNOSIS — R0602 Shortness of breath: Secondary | ICD-10-CM | POA: Insufficient documentation

## 2023-09-15 DIAGNOSIS — R197 Diarrhea, unspecified: Secondary | ICD-10-CM | POA: Insufficient documentation

## 2023-09-15 DIAGNOSIS — Z20822 Contact with and (suspected) exposure to covid-19: Secondary | ICD-10-CM | POA: Diagnosis not present

## 2023-09-15 DIAGNOSIS — R918 Other nonspecific abnormal finding of lung field: Secondary | ICD-10-CM

## 2023-09-15 DIAGNOSIS — R109 Unspecified abdominal pain: Secondary | ICD-10-CM | POA: Diagnosis not present

## 2023-09-15 DIAGNOSIS — Z7982 Long term (current) use of aspirin: Secondary | ICD-10-CM | POA: Diagnosis not present

## 2023-09-15 DIAGNOSIS — R5383 Other fatigue: Secondary | ICD-10-CM | POA: Diagnosis not present

## 2023-09-15 DIAGNOSIS — R110 Nausea: Secondary | ICD-10-CM | POA: Diagnosis not present

## 2023-09-15 DIAGNOSIS — J8410 Pulmonary fibrosis, unspecified: Secondary | ICD-10-CM | POA: Diagnosis not present

## 2023-09-15 LAB — COMPREHENSIVE METABOLIC PANEL
ALT: 8 U/L (ref 0–44)
AST: 18 U/L (ref 15–41)
Albumin: 4.2 g/dL (ref 3.5–5.0)
Alkaline Phosphatase: 39 U/L (ref 38–126)
Anion gap: 10 (ref 5–15)
BUN: 16 mg/dL (ref 8–23)
CO2: 24 mmol/L (ref 22–32)
Calcium: 9.3 mg/dL (ref 8.9–10.3)
Chloride: 104 mmol/L (ref 98–111)
Creatinine, Ser: 1.16 mg/dL (ref 0.61–1.24)
GFR, Estimated: >60 mL/min (ref >60)
Glucose, Bld: 91 mg/dL (ref 70–99)
Potassium: 3.7 mmol/L (ref 3.5–5.1)
Sodium: 138 mmol/L (ref 135–145)
Total Bilirubin: 0.8 mg/dL (ref 0.0–1.2)
Total Protein: 6.7 g/dL (ref 6.5–8.1)

## 2023-09-15 LAB — CBC
HCT: 44 % (ref 39.0–52.0)
Hemoglobin: 14.9 g/dL (ref 13.0–17.0)
MCH: 30.5 pg (ref 26.0–34.0)
MCHC: 33.9 g/dL (ref 30.0–36.0)
MCV: 90.2 fL (ref 80.0–100.0)
Platelets: 160 10*3/uL (ref 150–400)
RBC: 4.88 MIL/uL (ref 4.22–5.81)
RDW: 12.6 % (ref 11.5–15.5)
WBC: 7.4 10*3/uL (ref 4.0–10.5)
nRBC: 0 % (ref 0.0–0.2)

## 2023-09-15 LAB — URINALYSIS, ROUTINE W REFLEX MICROSCOPIC
Bilirubin Urine: NEGATIVE
Glucose, UA: NEGATIVE mg/dL
Hgb urine dipstick: NEGATIVE
Ketones, ur: 15 mg/dL — AB
Leukocytes,Ua: NEGATIVE
Nitrite: NEGATIVE
Protein, ur: NEGATIVE mg/dL
Specific Gravity, Urine: 1.01 (ref 1.005–1.030)
pH: 6.5 (ref 5.0–8.0)

## 2023-09-15 LAB — LIPASE, BLOOD: Lipase: 31 U/L (ref 11–51)

## 2023-09-15 LAB — RESP PANEL BY RT-PCR (RSV, FLU A&B, COVID)  RVPGX2
Influenza A by PCR: NEGATIVE
Influenza B by PCR: NEGATIVE
Resp Syncytial Virus by PCR: NEGATIVE
SARS Coronavirus 2 by RT PCR: NEGATIVE

## 2023-09-15 LAB — TROPONIN I (HIGH SENSITIVITY)
Troponin I (High Sensitivity): 8 ng/L (ref <18)
Troponin I (High Sensitivity): 7 ng/L (ref <18)

## 2023-09-15 LAB — BRAIN NATRIURETIC PEPTIDE: B Natriuretic Peptide: 78.1 pg/mL (ref 0.0–100.0)

## 2023-09-15 MED ORDER — GABAPENTIN 300 MG PO CAPS
900.0000 mg | ORAL_CAPSULE | Freq: Once | ORAL | Status: AC
  Administered 2023-09-15: 900 mg via ORAL
  Filled 2023-09-15: qty 3

## 2023-09-15 MED ORDER — SODIUM CHLORIDE 0.9 % IV BOLUS
1000.0000 mL | Freq: Once | INTRAVENOUS | Status: AC
  Administered 2023-09-15: 1000 mL via INTRAVENOUS

## 2023-09-15 NOTE — ED Notes (Signed)
 ED Provider at bedside.

## 2023-09-15 NOTE — Discharge Instructions (Addendum)
 It was a pleasure taking care of you today. As discussed, all of your labs were reassuring. Continue to drink extra fluids over the next few days. Follow-up with PCP in 2-3 days for a recheck. Return to the ER for new or worsening symptoms.   Your chest x-ray showed nodules in your upper lobes.  Please follow-up with PCP for further evaluation.  You likely will need a CT of your chest.

## 2023-09-15 NOTE — ED Triage Notes (Signed)
 Pt is here for sob and diarrhea since 2am Monday.  Pt was seen at PCP today and has been feeling unwell so he was advised to come to ED.  Pt has had 6-8 episodes of watery diarrhea Monday and Tuesday and 2 episodes today.  No one else sick around him.  No recent ABT.  Pt has been sob.

## 2023-09-15 NOTE — ED Notes (Signed)
 Patient attempted to produce stool sample, unsuccessful. Reports just passing gas.

## 2023-09-15 NOTE — ED Provider Notes (Signed)
[facility] AT [facility] Provider Note   CSN: [id number] Arrival date & time: 09/15/23  1222     History  Chief Complaint  Patient presents with   Diarrhea    Carlos Suarez is a 78 y.o. male with a past medical history significant for dilated cardiomyopathy, CAD, and hyperlipidemia who presents to the ED due to diarrhea x 3 days.  Patient had roughly 6-8 episodes of watery diarrhea 3 days ago and has had persistent diarrhea since.  Diarrhea is nonbloody.  No recent antibiotics.  No recent travel or ingestion of undercooked foods.  Denies nausea and vomiting.  Patient was seen by PCP today and sent to the ED for further evaluation due to pale appearance and persistent diarrhea.  Patient also noted to be noticeably short of breath during PCP visit.  Patient admits to some shortness of breath.  Denies associated chest pain.  No history of blood clots.  Denies sore throat and cough.  No fever.  Wife at bedside currently well with no similar symptoms.  History obtained from patient and past medical records. No interpreter used during encounter.       Home Medications Prior to Admission medications   Medication Sig Start Date End Date Taking? Authorizing Provider  aspirin EC 81 MG tablet Take 81 mg by mouth daily. Swallow whole.    [provider]  baclofen (LIORESAL) 10 MG tablet Take 1 tablet (10 mg total) by mouth 2 (two) times daily. 08/27/23   Marcos Eke, PA-C  ezetimibe (ZETIA) 10 MG tablet  02/23/23   [provider]  gabapentin (NEURONTIN) 300 MG capsule TAKE 1 CAPSULE BY MOUTH 3 TIMES DAILY Patient taking differently: Take 300 mg by mouth 3 (three) times daily. 2700 mg daily 05/19/23   Marcos Eke, PA-C  metoprolol succinate (TOPROL-XL) 50 MG 24 hr tablet TAKE 1 TABLET BY MOUTH EVERY DAY 07/26/23   Croitoru, Rachelle Hora, MD  nitroGLYCERIN (NITROSTAT) 0.4 MG SL tablet Place 1 tablet (0.4 mg total) under the tongue every 5 (five)  minutes as needed for chest pain. 09/11/21   Arty Baumgartner, NP  rosuvastatin (CRESTOR) 40 MG tablet TAKE 1 TABLET BY MOUTH EVERY DAY 08/02/23   Croitoru, Rachelle Hora, MD      Allergies    Fluothane [halothane], Oxycodone, and Oxycodone hcl    Review of Systems   Review of Systems  Constitutional:  Negative for chills and fever.  Respiratory:  Positive for shortness of breath. Negative for cough.   Cardiovascular:  Negative for chest pain.  Gastrointestinal:  Positive for diarrhea. Negative for abdominal pain, nausea and vomiting.    Physical Exam Updated Vital Signs BP (!) 154/85   Pulse 71   Temp 99.2 F (37.3 C) (Oral)   Resp 20   SpO2 99%  Physical Exam Vitals and nursing note reviewed.  Constitutional:      General: He is not in acute distress.    Appearance: He is not ill-appearing.  HENT:     Head: Normocephalic.  Eyes:     Pupils: Pupils are equal, round, and reactive to light.  Cardiovascular:     Rate and Rhythm: Normal rate and regular rhythm.     Pulses: Normal pulses.     Heart sounds: Normal heart sounds. No murmur heard.    No friction rub. No gallop.  Pulmonary:     Effort: Pulmonary effort is normal.     Breath sounds: Normal breath sounds.  Abdominal:  General: Abdomen is flat. There is no distension.     Palpations: Abdomen is soft.     Tenderness: There is no abdominal tenderness. There is no guarding or rebound.     Comments: Abdomen soft, nondistended, nontender to palpation in all quadrants without guarding or peritoneal signs. No rebound.   Musculoskeletal:        General: Normal range of motion.     Cervical back: Neck supple.  Skin:    General: Skin is warm and dry.  Neurological:     General: No focal deficit present.     Mental Status: He is alert.  Psychiatric:        Mood and Affect: Mood normal.        Behavior: Behavior normal.     ED Results / Procedures / Treatments   Labs (all labs ordered are listed, but only abnormal  results are displayed) Labs Reviewed  URINALYSIS, ROUTINE W REFLEX MICROSCOPIC - Abnormal; Notable for the following components:      Result Value   Ketones, ur 15 (*)    All other components within normal limits  RESP PANEL BY RT-PCR (RSV, FLU A&B, COVID)  [id number]  GASTROINTESTINAL PANEL BY PCR, STOOL (REPLACES STOOL CULTURE)  C DIFFICILE QUICK SCREEN W PCR REFLEX    CBC  COMPREHENSIVE METABOLIC PANEL  LIPASE, BLOOD  BRAIN NATRIURETIC PEPTIDE  TROPONIN I (HIGH SENSITIVITY)  TROPONIN I (HIGH SENSITIVITY)    EKG EKG Interpretation Date/Time:  Wednesday September 15 2023 12:53:35 EST Ventricular Rate:  86 PR Interval:  184 QRS Duration:  126 QT Interval:  402 QTC Calculation: 481 R Axis:   -71  Text Interpretation: Sinus rhythm with Premature atrial complexes with Abberant conduction Left axis deviation Non-specific intra-ventricular conduction block Cannot rule out Anterior infarct , age undetermined Abnormal ECG Interpretation limited secondary to artifact Confirmed by Vonita Moss [phone]) on 09/15/2023 7:03:55 PM  Radiology DG Chest 2 View Result Date: 09/15/2023 CLINICAL DATA:  Shortness of breath. EXAM: CHEST - 2 VIEW COMPARISON:  None Available. FINDINGS: Indeterminate bilateral upper lobe nodules measure up to 2 cm on the right, possibly granuloma. Further evaluation with chest CT recommended. No consolidative changes. There is no pleural effusion or pneumothorax. The cardiac silhouette is within normal limits. No acute osseous pathology. IMPRESSION: 1. No acute cardiopulmonary process. 2. Indeterminate bilateral upper lobe nodules, favored to represent granuloma. Chest CT is recommended. Electronically Signed   By: Elgie Collard M.D.   On: 09/15/2023 18:25    Procedures Procedures    Medications Ordered in ED Medications  sodium chloride 0.9 % bolus 1,000 mL (0 mLs Intravenous Stopped 09/15/23 1736)  gabapentin (NEURONTIN) capsule 900 mg (900 mg Oral Given 09/15/23  1647)    ED Course/ Medical Decision Making/ A&P Clinical Course as of 09/15/23 1920  Wed Sep 15, 2023  1814 Reassessed patient. He admits to improvement in symptoms after IVFs. No episodes of diarrhea here in the ED.  [CA]    Clinical Course User Index [CA] Mannie Stabile, PA-C                                 Medical Decision Making Amount and/or Complexity of Data Reviewed Independent Historian: spouse External Data Reviewed: notes.    Details: PCP note Labs: ordered. Decision-making details documented in ED Course. Radiology: ordered and independent interpretation performed. Decision-making details documented in ED Course. ECG/medicine tests: ordered and  independent interpretation performed. Decision-making details documented in ED Course.  Risk Prescription drug management.   This patient presents to the ED for concern of diarrhea, this involves an extensive number of treatment options, and is a complaint that carries with it a high risk of complications and morbidity.  The differential diagnosis includes gastroenteritis, C. difficile, pancreatitis, acute cholecystitis, etc  78 year old male presents to the ED due to persistent diarrhea x 3 days.  Seen by PCP and sent to the ED due to pale appearance and visibly short of breath.  No nausea or vomiting.  No fever.  No recent antibiotics.  Upon arrival patient afebrile, not tachycardic or hypoxic.  Patient in no acute distress.  Abdomen soft, nondistended, nontender.  Low suspicion for acute abdomen. Routine labs ordered in triage.  Chest x-ray given shortness of breath.  RVP ordered.  IV fluids given.  CBC unremarkable.  CMP unremarkable.  Normal renal function.  Lipase normal.  Low suspicion for pancreatitis.  UA negative for signs of infection.  RVP negative.  BNP normal.  Troponin x 2 normal. EKG sinus rhythm. No evidence of acute ischemia. Low suspicion for ACS.  Chest x-ray personally reviewed and interpreted which  demonstrates indeterminant bilateral upper lobe nodules which could be granulomas.  Discussed at length with patient incidental finding.  Do not feel CT chest needs to be performed in an emergent setting tonight.  Advised patient to follow-up with PCP to have CT imaging done.  Upon reassessment, patient admits to improvement after IV fluids.  Has not had any diarrhea while here in the ED.  No evidence of AKI.  Suspect symptoms related to diarrheal illness, likely viral in nature. No risk factors for C. Diff.  Abdomen soft, nondistended, nontender.  Low suspicion for acute abdomen.  Discussed with Dr. Eloise Harman who evaluated patient at bedside and agrees with assessment and plan.  Discussed oral hydration with patient and wife at bedside.  Also discussed a bland diet. Patient able to tolerate po here in the ED. Patient stable for discharge. Strict ED precautions discussed with patient. Patient states understanding and agrees to plan. Patient discharged home in no acute distress and stable vitals  Co morbidities that complicate the patient evaluation  CAD, HL Cardiac Monitoring: / EKG:  The patient was maintained on a cardiac monitor.  I personally viewed and interpreted the cardiac monitored which showed an underlying rhythm of: NSR  Social Determinants of Health:  Elderly >65        Final Clinical Impression(s) / ED Diagnoses Final diagnoses:  Diarrhea, unspecified type  Pulmonary nodules    Rx / DC Orders ED Discharge Orders     None         Jesusita Oka 09/15/23 1920    Rondel Baton, MD 09/17/23 [phone]

## 2023-09-15 NOTE — ED Notes (Signed)
Attempt IV placement x2 without success 

## 2023-09-17 ENCOUNTER — Encounter: Payer: Self-pay | Admitting: Cardiovascular Disease

## 2023-10-04 DIAGNOSIS — G500 Trigeminal neuralgia: Secondary | ICD-10-CM | POA: Diagnosis not present

## 2023-10-07 DIAGNOSIS — G4733 Obstructive sleep apnea (adult) (pediatric): Secondary | ICD-10-CM | POA: Diagnosis not present

## 2023-10-18 ENCOUNTER — Ambulatory Visit: Payer: Medicare PPO | Admitting: Nurse Practitioner

## 2023-10-19 ENCOUNTER — Other Ambulatory Visit: Payer: Self-pay

## 2023-10-19 ENCOUNTER — Telehealth: Payer: Self-pay | Admitting: Physician Assistant

## 2023-10-19 MED ORDER — GABAPENTIN 300 MG PO CAPS: 900.0000 mg | ORAL_CAPSULE | Freq: Three times a day (TID) | ORAL | 0 refills | Status: DC

## 2023-10-19 NOTE — Telephone Encounter (Signed)
 Pt. Cld as  instructed:"TAKE 1 CAPSULE BY MOUTH 3 TIMES DAILY, Starting Wed 05/19/2023, Normal, Last Dose: Not Recorded Patient taking differently: 300 mg Oral 3 times daily, 2700 mg daily"  Pt is following [facility] Rx amounts which differ from the instructions on the bottle and the Pharmacy will not refill, he has 3 days supply left and would like another Rx refill of Neurontin, please call back

## 2023-10-19 NOTE — Telephone Encounter (Signed)
 Spoke with pt, he only has 3 days worth of medication and would like to find out what he should do before this weekend.

## 2023-10-19 NOTE — Telephone Encounter (Signed)
 Sent rx into pharmacy 900mg  tid

## 2023-10-29 ENCOUNTER — Ambulatory Visit (HOSPITAL_BASED_OUTPATIENT_CLINIC_OR_DEPARTMENT_OTHER): Payer: Medicare PPO | Attending: Nurse Practitioner | Admitting: Pulmonary Disease

## 2023-10-29 DIAGNOSIS — G4739 Other sleep apnea: Secondary | ICD-10-CM | POA: Diagnosis not present

## 2023-11-02 DIAGNOSIS — G4739 Other sleep apnea: Secondary | ICD-10-CM

## 2023-11-02 NOTE — Procedures (Signed)
 CPAP 14 cm with airfit F20 full face mask Set EPR level at 3

## 2023-11-04 DIAGNOSIS — G4733 Obstructive sleep apnea (adult) (pediatric): Secondary | ICD-10-CM | POA: Diagnosis not present

## 2023-11-08 ENCOUNTER — Ambulatory Visit: Payer: Medicare PPO | Admitting: Nurse Practitioner

## 2023-11-17 ENCOUNTER — Encounter: Payer: Self-pay | Admitting: Nurse Practitioner

## 2023-11-17 ENCOUNTER — Other Ambulatory Visit: Payer: Self-pay | Admitting: Neurology

## 2023-11-17 ENCOUNTER — Ambulatory Visit: Payer: Medicare PPO | Admitting: Nurse Practitioner

## 2023-11-17 VITALS — BP 150/74 | HR 72 | Ht 73.0 in | Wt 257.0 lb

## 2023-11-17 DIAGNOSIS — Z6833 Body mass index (BMI) 33.0-33.9, adult: Secondary | ICD-10-CM | POA: Diagnosis not present

## 2023-11-17 DIAGNOSIS — E669 Obesity, unspecified: Secondary | ICD-10-CM

## 2023-11-17 DIAGNOSIS — G4739 Other sleep apnea: Secondary | ICD-10-CM

## 2023-11-17 NOTE — Patient Instructions (Signed)
 Continue to use CPAP every night, minimum of 4-6 hours a night.  Change equipment as directed. Wash your tubing with warm soap and water daily, hang to dry. Wash humidifier portion weekly. Use bottled, distilled water and change daily Be aware of reduced alertness and do not drive or operate heavy machinery if experiencing this or drowsiness.  Exercise encouraged, as tolerated. Healthy weight management discussed.  Avoid or decrease alcohol consumption and medications that make you more sleepy, if possible. Notify if persistent daytime sleepiness occurs even with consistent use of PAP therapy.  I've adjusted your CPAP settings today  Let's switch you to a DreamWear full face mask - call your medical supply company in the next few days to ask about timeline on this  Follow up in 6 weeks with Florentina [name] Allysen Lazo,NP to see how new settings and mask are working, or sooner, if needed

## 2023-11-17 NOTE — Progress Notes (Unsigned)
 @Patient  ID: Carlos Suarez, male    DOB: 1945/12/25, 78 y.o.   MRN: [id number]  Chief Complaint  Patient presents with   Follow-up    Patient needs a new mask fit     Referring provider: [name], MD  HPI: 78 year old male, never smoker referred for sleep consult.  Past medical history significant for CAD, cardiomyopathy, frequent PVCs, trigeminal neuralgia, HLD.  TEST/EVENTS:   06/07/2023:OV with Tobi Leinweber NP for sleep consult, referred by Dr. Renne Crigler.  His wife is with him today who helps provide some of his history.  She has sleep apnea and is concerned that he also does. Loud snoring, witnessed apneas by his wife. He has had issues with reflux in the past. Has woken up gasping for air when these were happening but has not occurred more recently. Dry mouth during the night.  He is not sure if he is more tired during the day or if it is the current medications that he is on. He's recently been started on gabapentin and baclofen for trigeminal neuralgia.  Feels okay when he wakes up in the morning.  Does have some restlessness when he sleeps at night.  Denies any drowsy driving, morning headaches, sleep parasomnia/paralysis.  No history of narcolepsy or symptoms of cataplexy. He goes to bed between 10 PM and midnight.  Usually falls asleep relatively quickly.  They have an older dog who tends to wake him up at night; otherwise, he is usually up 1-2 times a night to use the restroom.  Gets up around 7 to 9 AM in the morning.  He is retired.  Weight is down 5 to 8 pounds over the last 2 years.  Never had a previous sleep study.  Does not currently wear CPAP or oxygen. He has a history of hypertension, PVCs and cardiomyopathy.  No history of stroke or diabetes.  He follows with cardiology.  He had a stent placed in January 2023. He is a never smoker.  Does not drink alcohol.  No excessive caffeine intake.  No sleep aids. Lives with his wife of 54 years.  Has adult children.  Family history of  brother with asthma. [place] 2  11/17/2023: Today - follow up  10/18/2023-11/16/2023 CPAP 5-20 cmH2O 28/30 days; 93% >4 hr; average use 8 hr 4 min Pressure 95th 12.1 Leaks 95th 43.8 AHI 9.6  Allergies  Allergen Reactions   Fluothane [Halothane] Nausea And Vomiting   Oxycodone Other (See Comments)    confusion   Oxycodone Hcl Other (See Comments)     There is no immunization history on file for this patient.  Past Medical History:  Diagnosis Date   Family history of breast cancer in male    Family history of pancreatic cancer    Legally blind in right eye, as defined in [place]     Tobacco History: Social History   Tobacco Use  Smoking Status Never  Smokeless Tobacco Never   Counseling given: Not Answered   Outpatient Medications Prior to Visit  Medication Sig Dispense Refill   aspirin EC 81 MG tablet Take 81 mg by mouth daily. Swallow whole.     baclofen (LIORESAL) 10 MG tablet Take 1 tablet (10 mg total) by mouth 2 (two) times daily. 60 each 6   ezetimibe (ZETIA) 10 MG tablet      gabapentin (NEURONTIN) 300 MG capsule Take 3 capsules (900 mg total) by mouth 3 (three) times daily. 2700 mg daily 270 capsule 0   metoprolol  succinate (TOPROL-XL) 50 MG 24 hr tablet TAKE 1 TABLET BY MOUTH EVERY DAY 90 tablet 2   nitroGLYCERIN (NITROSTAT) 0.4 MG SL tablet Place 1 tablet (0.4 mg total) under the tongue every 5 (five) minutes as needed for chest pain. 25 tablet 0   rosuvastatin (CRESTOR) 40 MG tablet TAKE 1 TABLET BY MOUTH EVERY DAY 90 tablet 2   No facility-administered medications prior to visit.     Review of Systems:   Constitutional: No night sweats, fevers, chills, or lassitude. +weight change, occasional daytime fatigue  HEENT: No headaches, difficulty swallowing, tooth/dental problems, or sore throat. No sneezing, itching, ear ache. +morning nasal congestion, dry mouth at night CV:  No chest pain, orthopnea, PND, swelling in lower extremities, anasarca, dizziness,  palpitations, syncope Resp: +snoring, witnessed apneas; baseline shortness of breath with exertion. No excess mucus or change in color of mucus. No productive or non-productive. No hemoptysis. No wheezing.  No chest wall deformity GI:  No heartburn, indigestion, abdominal pain, nausea, vomiting, diarrhea, change in bowel habits, loss of appetite, bloody stools.  GU: No nocturia Skin: No rash, lesions, ulcerations MSK:  No joint pain or swelling.   Neuro: No dizziness or lightheadedness.  Psych: No depression or anxiety. Mood stable. +sleep disturbance    Physical Exam:  BP (!) 150/74 (BP Location: Left Arm, Patient Position: Sitting)   Pulse 72   Ht 6\' 1"  (1.854 m)   Wt 257 lb (116.6 kg)   SpO2 100%   BMI 33.91 kg/m   GEN: Pleasant, interactive, well-appearing; obese; in no acute distress HEENT:  Normocephalic and atraumatic. PERRLA. Sclera white. Nasal turbinates pink, moist and patent bilaterally. No rhinorrhea present. Oropharynx pink and moist, without exudate or edema. No lesions, ulcerations, or postnasal drip. Mallampati II NECK:  Supple w/ fair ROM. No JVD present. Thyroid symmetrical with no goiter or nodules palpated. No lymphadenopathy.   CV: RRR, no m/r/g, no peripheral edema. Pulses intact, +2 bilaterally. No cyanosis, pallor or clubbing. PULMONARY:  Unlabored, regular breathing. Clear bilaterally A&P w/o wheezes/rales/rhonchi. No accessory muscle use.  GI: BS present and normoactive. Soft, non-tender to palpation. No organomegaly or masses detected. MSK: No erythema, warmth or tenderness. Cap refil <2 sec all extrem. No deformities or joint swelling noted.  Neuro: A/Ox3. No focal deficits noted.   Skin: Warm, no lesions or rashe Psych: Normal affect and behavior. Judgement and thought content appropriate.     Lab Results:  CBC    Component Value Date/Time   WBC 7.4 09/15/2023 1250   RBC 4.88 09/15/2023 1250   HGB 14.9 09/15/2023 1250   HGB 13.8 09/02/2021 1233    HCT 44.0 09/15/2023 1250   HCT 42.7 09/02/2021 1233   PLT 160 09/15/2023 1250   PLT 161 09/02/2021 1233   MCV 90.2 09/15/2023 1250   MCV 93 09/02/2021 1233   MCH 30.5 09/15/2023 1250   MCHC 33.9 09/15/2023 1250   RDW 12.6 09/15/2023 1250   RDW 12.2 09/02/2021 1233    BMET    Component Value Date/Time   NA 138 09/15/2023 1550   NA 142 09/02/2021 1233   K 3.7 09/15/2023 1550   CL 104 09/15/2023 1550   CO2 24 09/15/2023 1550   GLUCOSE 91 09/15/2023 1550   BUN 16 09/15/2023 1550   BUN 17 09/02/2021 1233   CREATININE 1.16 09/15/2023 1550   CALCIUM 9.3 09/15/2023 1550   GFRNONAA >60 09/15/2023 1550    BNP    Component Value Date/Time  BNP 78.1 09/15/2023 1250     Imaging:  Sleep Study Documents Result Date: 11/12/2023 Ordered by an unspecified provider.  Cpap titration Result Date: 10/29/2023 Oretha Milch, MD     11/02/2023  5:29 PM CPAP 14 cm with airfit F20 full face mask Set EPR level at 3   Administration History     None           No data to display          No results found for: "NITRICOXIDE"      Assessment & Plan:   No problem-specific Assessment & Plan notes found for this encounter.    I spent 35 minutes of dedicated to the care of this patient on the date of this encounter to include pre-visit review of records, face-to-face time with the patient discussing conditions above, post visit ordering of testing, clinical documentation with the electronic health record, making appropriate referrals as documented, and communicating necessary findings to members of the patients care team.  Noemi Chapel, NP 11/17/2023  Pt aware and understands NP's role.

## 2023-11-19 ENCOUNTER — Encounter: Payer: Self-pay | Admitting: Nurse Practitioner

## 2023-11-19 NOTE — Assessment & Plan Note (Signed)
 Reviewed correlation between obesity and sleep apnea.  Healthy weight loss encouraged.

## 2023-11-19 NOTE — Assessment & Plan Note (Signed)
 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 Reviewed risks of untreated severe sleep apnea and potential treatment options. Completed in lab titration study and was well controlled on CPAP 14 cmH2O without central emergences. Tolerated well with EPR at level 3. He does have some difficulties with current masks. We reviewed alternative options today. He would prefer to stay with a full face but likes the hybrid. Will change him to DreamWear full face as this seemed to fit him better and was more comfortable. Changed CPAP setting to set pressure of 14 cmH2O and EPR level 3. Aware of proper care/use. Excellent compliance so far. Residual AHI 9.6 with moderate leaks, which should improve with pressure change. Will reassess at follow up. Safe driving practices reviewed.   Patient Instructions  Continue to use CPAP every night, minimum of 4-6 hours a night.  Change equipment as directed. Wash your tubing with warm soap and water daily, hang to dry. Wash humidifier portion weekly. Use bottled, distilled water and change daily Be aware of reduced alertness and do not drive or operate heavy machinery if experiencing this or drowsiness.  Exercise encouraged, as tolerated. Healthy weight management discussed.  Avoid or decrease alcohol consumption and medications that make you more sleepy, if possible. Notify if persistent daytime sleepiness occurs even with consistent use of PAP therapy.  I've adjusted your CPAP settings today  Let's switch you to a DreamWear full face mask - call your medical supply company in the next few days to ask about timeline on this  Follow up in 6 weeks with Carlos [name] [name] [name],NP to see how new settings and mask are working, or sooner, if needed

## 2023-11-29 ENCOUNTER — Encounter: Payer: Self-pay | Admitting: Physician Assistant

## 2023-11-29 ENCOUNTER — Ambulatory Visit: Payer: Medicare PPO | Admitting: Physician Assistant

## 2023-11-29 VITALS — BP 124/72 | HR 91 | Resp 20 | Ht 73.0 in | Wt 257.0 lb

## 2023-11-29 DIAGNOSIS — G500 Trigeminal neuralgia: Secondary | ICD-10-CM

## 2023-11-29 MED ORDER — GABAPENTIN 300 MG PO CAPS: 900.0000 mg | ORAL_CAPSULE | Freq: Three times a day (TID) | ORAL | 11 refills | Status: AC

## 2023-11-29 MED ORDER — BACLOFEN 10 MG PO TABS: 10.0000 mg | ORAL_TABLET | Freq: Two times a day (BID) | ORAL | 11 refills | Status: DC

## 2023-11-29 NOTE — Progress Notes (Signed)
 NEUROLOGY CONSULTATION NOTE  Carlos Suarez MRN: [id number] DOB: [birth year]   Referring provider: [name], MD  Primary care provider: [name], MD   Reason for consult:  facial and maxillary pain   Assessment/Plan:   Right facial pain, maxillary pain likely due to trigeminal neuralgia    This is a delightful 78 yo man with 18 month history of unresolving right facial pain, following a V2 pattern, suspicious for trigeminal neuralgia.  Prior MRI of the brain was essentially unremarkable, with a dominant left vertebral artery as well as noted partial bilateral mastoid opacification.  MRA of the face to further evaluate the trigeminal nerve was negative for enhancement or masslike findings.  Of note, the right superior cerebellar artery trace versus next to the right trigeminal nerve was just beyond the root entry zone. There is no evidence of fracture or aggressive bone lesions.  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 Initially Carlos Suarez was treated with   gabapentin 100 mg 3 times daily, increasing the doses  to 900 mg tid.  Carlos Suarez was on Tegretol 200 mg twice daily, without reaching a therapeutic response.  Eventually, Tegretol was discontinued adding baclofen 5 mg twice daily, then prescribed at 10 mg twice daily  for comfort.   Carlos Suarez is able to perform activities of daily living without difficulty. Carlos Suarez feels that today, Carlos Suarez had a "good day, comfortable, with minimal symptoms"-although not resolved Carlos Suarez was evaluated at [facility], at the trigeminal neuralgia clinic or other options, treatment, opinion.  This patient has been treated since June 2023, without reaching resolution of symptoms, with assessment as follows"   "Trigeminal neuralgia: Patient is currently being treated by neurology at [facility] health Carlos Suarez has tried and failed Tegretol. Currently on a high dose of gabapentin and baclofen with suboptimal results. I believe that his cervical  spine could possibly be contributing to his symptoms. Carlos Suarez had a MRI of his trigeminal nerve a year ago that showed degenerative facet spurring on the right at C3-4 with advanced right foraminal stenosis. I have given the patient options of further exploring his neck with an MRI of the cervical spine or referral to [facility] to be evaluated for gamma knife. Patient reports that Carlos Suarez is going to discuss this with his PCP and neurologist. Recommend Carlos Suarez follow-up as needed." Patient would like to discuss with [name] at the same facility, as Carlos Suarez feels that all his questions were not answered to his satisfaction during the first meeting. Carlos Suarez also wants to discuss with his PCP prior to considering Gamma knife or other alternative procedures.    [facility] evaluation (second opinion) for Trigeminal Neuralgia:    Recommendations  Continue gabapentin 900 mg 3 times daily. prescription sent to pharmacy. Side effects priorly discussed  Baclofen 10 mg bid. side effects discussed  Follow-up in 6 months  Subjective:    Patient returns today in follow-up. Today Carlos Suarez reports that his symptoms are manageable. "Today was a good day". "Approaching a steady state"Carlos Suarez says. Carlos Suarez still feels as if Carlos Suarez has a small ulcer in the mucosa, although there is no presence of it on exam. No new symptoms are reported. The sensation, as before, "goes up to the eye socket and if I touch the gums over on the right, may trigger the symptoms ". Symptoms may appear when touching the mustache, that would elicit the pain "but not always". Avoiding crunchy foods. Has seen [facility], agreeing with current  therapy,  as Gamma Knife "permanent changes may not be necessary".   Also, the pain is triggered by conversations, brushing, "from the right nose to the right eye " .  Carlos Suarez is alert, oriented, and does not demonstrate any memory deficiencies, Carlos Suarez is very eloquent and able to demonstrate adequate, if not excellent memory.     Initial  visit 02/06/2022 this is a very pleasant 78 year old man with a history of hypertension, hyperlipidemia, CAD status post stent placement, seen today for evaluation of R facial and maxillary pain. In review, in December, the patient was seen by a dentist due to tooth sensitivity, requiring dental work. At the time, Carlos Suarez also reported sensitivity in the right eyebrow in a vertical line down to the R side of the nose and R nasolabial fold.   Carlos Suarez tried topical therapy that did not help.  Dentist suggested that they might be sinuses due to light pressure on the right maxillary area causing tenderness. Denies numbness or tingling, being able to reproduce the pain, "at one point, I felt the pain was coming from the neck". The R eyebrow pain is now "coming and going, about a 2 or 3". Maxillary CT was negative for abnormal findings. Carlos Suarez tried Flonase and chlorpheniramine for 6 weeks, only improving to 50 percent, but at times is severe. Carlos Suarez denies any change in the sense of smell,  cold intolerance, head injury, pain with chewing. Initially the pain was extending to the right temple but this subsided. 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 No recent infection but his wife had shingles 1 months ago.  Carlos Suarez was seen by ophthalmology as well, as 50 years ago Carlos Suarez had eye trauma s/ scleral buckle becoming legally blind , but ophthalmic source was ruled out.        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  Normal  marrow signal, dominant left vertebral artery, other major intracranial flow voids are preserved.  MRI of the face with trigeminal view with and without contrast 02/23/2022 negative for mass or inflammation, right superior cerebellar artery transverses in close proximity to the right trigeminal nerve near  the root entry zone.  MRI face, trigeminal w and wo contrast 05/14/23  shows the vascular loop passing adjacent to the root entry zone of the right fifth nerve and possibly be an anterior inferior cerebellar artery loop or superior cerebellar artery loop. Similar appearance to the prior study. No evidence of neuroma.  PAST MEDICAL HISTORY: Past Medical History:  Diagnosis Date   Family history of breast cancer in male    Family history of pancreatic cancer    Legally blind in right eye, as defined in [place]     PAST SURGICAL HISTORY: Past Surgical History:  Procedure Laterality Date   CORONARY STENT INTERVENTION N/A 09/10/2021   Procedure: CORONARY STENT INTERVENTION;  Surgeon: Marykay Lex, MD;  Location: [facility] INVASIVE CV LAB;  Service: Cardiovascular;  Laterality: N/A;   LEFT HEART CATH AND CORONARY ANGIOGRAPHY N/A 09/10/2021   Procedure: LEFT HEART CATH AND CORONARY ANGIOGRAPHY;  Surgeon: Marykay Lex, MD;  Location: [facility] INVASIVE CV LAB;  Service: Cardiovascular;  Laterality: N/A;    MEDICATIONS: Current Outpatient Medications on File Prior to Visit  Medication Sig Dispense Refill   aspirin EC 81 MG tablet  Take 81 mg by mouth daily. Swallow whole.     baclofen (LIORESAL) 10 MG tablet Take 1 tablet (10 mg total) by mouth 2 (two) times daily. 60 each 6   ezetimibe (ZETIA) 10 MG tablet      gabapentin (NEURONTIN) 300 MG capsule TAKE 3 CAPSULES (900 MG TOTAL) BY MOUTH 3 (THREE) TIMES DAILY. 2700 MG DAILY 270 capsule 0   metoprolol succinate (TOPROL-XL) 50 MG 24 hr tablet TAKE 1 TABLET BY MOUTH EVERY DAY 90 tablet 2   nitroGLYCERIN (NITROSTAT) 0.4 MG SL tablet Place 1 tablet (0.4 mg total) under the tongue every 5 (five) minutes as needed for chest pain. 25 tablet 0   rosuvastatin (CRESTOR) 40 MG tablet TAKE 1 TABLET BY MOUTH EVERY DAY 90 tablet 2   No current facility-administered medications on file prior to visit.    ALLERGIES: Allergies  Allergen Reactions   Fluothane  [Halothane] Nausea And Vomiting   Oxycodone Other (See Comments)    confusion   Oxycodone Hcl Other (See Comments)    FAMILY HISTORY: Family History  Problem Relation Age of Onset   Breast cancer Brother 1   Pancreatic cancer Cousin        pat first cousin   Pancreatic cancer Cousin        pat first cousin      Objective:    General: NAD, well-groomed  Head: Normocephalic  Eyes: Right eye legally blind after blunt trauma to the eye  Neck: supple, no paraspinal tenderness, full range of motion Back: No paraspinal tenderness Neurological Exam: Mental status: Alert and oriented to person, place and time, recent and remote memory intact, follows knowledge intact, attention and concentration intact, speech fluent and not dysarthric, language intact.   Cranial nerves: CN I: not tested CN II: L pupils equal, round and reactive to light, visual fields intact.  Right lens opaque CN III, IV, VI: Full ROM, no nystagmus, no proptosis.  Carlos Suarez has decreased vision on the right (legally blind). CN V: Facial sensation intact no tenderness to palpation on the right eyebrow today, there is no painful sensation in the right nasolabial area at the V2 region of the nerve today CN VII: upper and lower face symmetric CN VIII: Hearing is now intact CN IX, X: Intact gag, uvula midline CN XI: Sternocleidomastoid and trapezius muscles intact CN XII: Tongue midline.  Area inside the mouth, shows very mild mucosal tenderness, especially at the frontal tooth and incisor area Bulk & Tone: Normal, no fasciculations  motor: Muscle strength 5 out of 5 throughout  sensation: Pinprick, temperature and vibratory sensation intact.  Gait: Normal station and stride.       Marlowe Kays, PA-C    Time spent: 32 minutes.

## 2023-11-29 NOTE — Patient Instructions (Addendum)
  Continue gabapentin 900 mg 3 times a day   Baclofen 10 mg take  twice a day   Follow up in 6 months

## 2023-12-05 DIAGNOSIS — G4733 Obstructive sleep apnea (adult) (pediatric): Secondary | ICD-10-CM | POA: Diagnosis not present

## 2023-12-28 DIAGNOSIS — Z961 Presence of intraocular lens: Secondary | ICD-10-CM | POA: Diagnosis not present

## 2023-12-28 DIAGNOSIS — H43812 Vitreous degeneration, left eye: Secondary | ICD-10-CM | POA: Diagnosis not present

## 2023-12-28 DIAGNOSIS — H524 Presbyopia: Secondary | ICD-10-CM | POA: Diagnosis not present

## 2023-12-28 DIAGNOSIS — H26492 Other secondary cataract, left eye: Secondary | ICD-10-CM | POA: Diagnosis not present

## 2024-01-04 DIAGNOSIS — G4733 Obstructive sleep apnea (adult) (pediatric): Secondary | ICD-10-CM | POA: Diagnosis not present

## 2024-01-14 ENCOUNTER — Ambulatory Visit: Admitting: Nurse Practitioner

## 2024-01-14 ENCOUNTER — Encounter: Payer: Self-pay | Admitting: Nurse Practitioner

## 2024-01-14 VITALS — BP 128/70 | HR 81 | Ht 73.0 in | Wt 254.4 lb

## 2024-01-14 DIAGNOSIS — G4739 Other sleep apnea: Secondary | ICD-10-CM | POA: Diagnosis not present

## 2024-01-14 DIAGNOSIS — Z9981 Dependence on supplemental oxygen: Secondary | ICD-10-CM | POA: Diagnosis not present

## 2024-01-14 NOTE — Progress Notes (Signed)
 @Patient  ID: Carlos Suarez, male    DOB: 1946/05/07, 78 y.o.   MRN: [id number]  Chief Complaint  Patient presents with   Sleep Apnea    Cpap mask fit adjustment    Referring provider: Imelda Man, MD  HPI: 78 year old male, never smoker followed for sleep apnea.  Past medical history significant for CAD, cardiomyopathy, frequent PVCs, trigeminal neuralgia, HLD.  TEST/EVENTS:  06/12/2023 HST: AHI 55.3/h, SpO2 low 59% 10/29/2023 CPAP titration >> optimal pressure 14 cmH2O with airfit F20 full face mask,  EPR at level 3  06/07/2023:OV with Destina Mantei NP for sleep consult, referred by Dr. Schuyler Custard.  His wife is with him today who helps provide some of his history.  She has sleep apnea and is concerned that he also does. Loud snoring, witnessed apneas by his wife. He has had issues with reflux in the past. Has woken up gasping for air when these were happening but has not occurred more recently. Dry mouth during the night.  He is not sure if he is more tired during the day or if it is the current medications that he is on. He's recently been started on gabapentin  and baclofen  for trigeminal neuralgia.  Feels okay when he wakes up in the morning.  Does have some restlessness when he sleeps at night.  Denies any drowsy driving, morning headaches, sleep parasomnia/paralysis.  No history of narcolepsy or symptoms of cataplexy. He goes to bed between 10 PM and midnight.  Usually falls asleep relatively quickly.  They have an older dog who tends to wake him up at night; otherwise, he is usually up 1-2 times a night to use the restroom.  Gets up around 7 to 9 AM in the morning.  He is retired.  Weight is down 5 to 8 pounds over the last 2 years.  Never had a previous sleep study.  Does not currently wear CPAP or oxygen. He has a history of hypertension, PVCs and cardiomyopathy.  No history of stroke or diabetes.  He follows with cardiology.  He had a stent placed in January 2023. He is a never smoker.  Does not  drink alcohol.  No excessive caffeine intake.  No sleep aids. Lives with his wife of 54 years.  Has adult children.  Family history of brother with asthma. 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  His central events made up 32% of his overall apneic events.  He feels unchanged compared to his last visit.  He did have some issues the third night of his study where the cannula and finger probe both came off.  He was not sure if this affected the results of his study.  He does not have any issues with drowsy driving, morning headaches or sleep parasomnia/paralysis.  Wants to discuss treatment options today.  11/17/2023: OV with Ugonna Keirsey NP for follow up after undergoing CPAP titration. He was optimally controlled on setting of 14 cmH2O with an EPR of level 3. He was tested on a F20 full face mask, which is what he is currently wearing at home; although, he has to switch back and forth between this and the [facility] due to the pressure on the bridge of his nose. He also likes to put his CPAP on and read some before he falls asleep, which he can't do with the full face over the bridge  of his nose. He finds the harder plastic on the [place] presses into his nose, which can be uncomfortable.  Otherwise, he is wearing his CPAP nightly. He does feel like it is helping with his sleep. Feels more restful. Not snoring with the CPAP. Denies drowsy driving or sleep parasomnias.  10/18/2023-11/16/2023 CPAP 5-20 cmH2O 28/30 days; 93% >4 hr; average use 8 hr 4 min Pressure 95th 12.1 Leaks 95th 43.8 AHI 9.6  01/14/2024: Today - follow up Patient presents today for follow-up.  At his last visit, we adjusted his CPAP pressures to a set pressure of 14 cmH2O.  He is still alternating back-and-forth between the [facility] fullface mask and the AirFit F20 in size large.  The AirFit F20 is more comfortable  but has a lot of leaks and he cannot wear his glasses with it.  The Randene Bustard has a plastic piece that tends to present to his face and he finds it uncomfortable.  It does tend to fit better and not leak as much.  When he has a night with a good seal, he does feel like he sleeps better and sleep is more restful.  The nights that he is having to fight with his mask fit and it is leaking more, he does not really notice a difference.  Not snoring with CPAP.  Denies drowsy driving or sleep parasomnias.  12/14/2023-01/12/2024 CPAP 14 cmH2O 30/30 days; 100% >4 hr; average use 7 hr 48 m Leaks 95th 59.4 AHI 5.5  Allergies  Allergen Reactions   Fluothane [Halothane] Nausea And Vomiting   Oxycodone Other (See Comments)    confusion   Oxycodone Hcl Other (See Comments)     There is no immunization history on file for this patient.  Past Medical History:  Diagnosis Date   Family history of breast cancer in male    Family history of pancreatic cancer    Legally blind in right eye, as defined in [place]      Tobacco History: Social History   Tobacco Use  Smoking Status Never  Smokeless Tobacco Never   Counseling given: Not Answered   Outpatient Medications Prior to Visit  Medication Sig Dispense Refill   aspirin  EC 81 MG tablet Take 81 mg by mouth daily. Swallow whole.     baclofen  (LIORESAL ) 10 MG tablet Take 1 tablet (10 mg total) by mouth 2 (two) times daily. 60 each 11   ezetimibe  (ZETIA ) 10 MG tablet      gabapentin  (NEURONTIN ) 300 MG capsule Take 3 capsules (900 mg total) by mouth 3 (three) times daily. 2700 mg daily 270 capsule 11   metoprolol  succinate (TOPROL -XL) 50 MG 24 hr tablet TAKE 1 TABLET BY MOUTH EVERY DAY 90 tablet 2   nitroGLYCERIN  (NITROSTAT ) 0.4 MG SL tablet Place 1 tablet (0.4 mg total) under the tongue every 5 (five) minutes as needed for chest pain. 25 tablet 0   rosuvastatin  (CRESTOR ) 40 MG tablet TAKE 1 TABLET BY MOUTH EVERY DAY 90 tablet 2   No facility-administered  medications prior to visit.     Review of Systems:   Constitutional: No weight change, night sweats, fevers, chills, or lassitude. +occasional daytime fatigue  HEENT: No headaches, difficulty swallowing, tooth/dental problems, or sore throat. No sneezing, itching, ear ache. +morning nasal congestion, dry mouth at night (improved) CV:  No chest pain, orthopnea, PND, swelling in lower extremities, anasarca, dizziness, palpitations, syncope Resp: +snoring, witnessed apneas (resolves with CPAP); baseline shortness of breath with exertion. No excess mucus or change in  color of mucus. No productive or non-productive. No hemoptysis. No wheezing.  No chest wall deformity GI:  No heartburn, indigestion GU: No nocturia Skin: No rash, lesions, ulcerations MSK:  No joint pain or swelling.   Neuro: No dizziness or lightheadedness.  Psych: No depression or anxiety. Mood stable. +sleep disturbance (improved)    Physical Exam:  BP 128/70 (BP Location: Right Arm, Patient Position: Sitting, Cuff Size: Normal)   Pulse 81   Ht 6\' 1"  (1.854 m)   Wt 254 lb 6.4 oz (115.4 kg)   SpO2 100%   BMI 33.56 kg/m   GEN: Pleasant, interactive, well-appearing; obese; in no acute distress HEENT:  Normocephalic and atraumatic. PERRLA. Sclera white. Nasal turbinates pink, moist and patent bilaterally. No rhinorrhea present. Oropharynx pink and moist, without exudate or edema. No lesions, ulcerations, or postnasal drip. Mallampati II NECK:  Supple w/ fair ROM. No JVD present. Thyroid  symmetrical with no goiter or nodules palpated. No lymphadenopathy.   CV: RRR, no m/r/g, no peripheral edema. Pulses intact, +2 bilaterally. No cyanosis, pallor or clubbing. PULMONARY:  Unlabored, regular breathing. Clear bilaterally A&P w/o wheezes/rales/rhonchi. No accessory muscle use.  GI: BS present and normoactive. Soft, non-tender to palpation. No organomegaly or masses detected. MSK: No erythema, warmth or tenderness. Cap refil <2  sec all extrem. No deformities or joint swelling noted.  Neuro: A/Ox3. No focal deficits noted.   Skin: Warm, no lesions or rashe Psych: Normal affect and behavior. Judgement and thought content appropriate.     Lab Results:  CBC    Component Value Date/Time   WBC 7.4 09/15/2023 1250   RBC 4.88 09/15/2023 1250   HGB 14.9 09/15/2023 1250   HGB 13.8 09/02/2021 1233   HCT 44.0 09/15/2023 1250   HCT 42.7 09/02/2021 1233   PLT 160 09/15/2023 1250   PLT 161 09/02/2021 1233   MCV 90.2 09/15/2023 1250   MCV 93 09/02/2021 1233   MCH 30.5 09/15/2023 1250   MCHC 33.9 09/15/2023 1250   RDW 12.6 09/15/2023 1250   RDW 12.2 09/02/2021 1233    BMET    Component Value Date/Time   NA 138 09/15/2023 1550   NA 142 09/02/2021 1233   K 3.7 09/15/2023 1550   CL 104 09/15/2023 1550   CO2 24 09/15/2023 1550   GLUCOSE 91 09/15/2023 1550   BUN 16 09/15/2023 1550   BUN 17 09/02/2021 1233   CREATININE 1.16 09/15/2023 1550   CALCIUM  9.3 09/15/2023 1550   GFRNONAA >60 09/15/2023 1550    BNP    Component Value Date/Time   BNP 78.1 09/15/2023 1250     Imaging:  No results found.   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 Reviewed risks of untreated severe sleep apnea and potential treatment options. Completed in lab titration study and was well controlled on CPAP 14 cmH2O without central emergences. Tolerated well with EPR at level 3.  At his last visit, adjusted him to a set pressure of 14 cmH2O, which improved his overall residual AHI to 5.5 rom 9.6. Still having moderate to large leaks. Seems to do better with hybrid full face but Randene Bustard is uncomfortable. I also think the large may be too  big. Provided him with AirFit F40 sample mask - sized to a medium. Advised to monitor and  notify if he's still having difficulties. Receives benefit when leaks don't disrupt his sleep. Aware of proper care/use. Excellent compliance so far. Safe driving practices reviewed.   Patient Instructions  Continue to use CPAP every night, minimum of 4-6 hours a night.  Change equipment as directed. Wash your tubing with warm soap and water daily, hang to dry. Wash humidifier portion weekly. Use bottled, distilled water and change daily Be aware of reduced alertness and do not drive or operate heavy machinery if experiencing this or drowsiness.  Exercise encouraged, as tolerated. Healthy weight management discussed.  Avoid or decrease alcohol consumption and medications that make you more sleepy, if possible. Notify if persistent daytime sleepiness occurs even with consistent use of PAP therapy.  Try the new mask I gave you   Follow up in 4 weeks via virtual visit with Katie Zoeya Gramajo,NP. If symptoms do not improve or worsen, please contact office for sooner follow up     I spent 35 minutes of dedicated to the care of this patient on the date of this encounter to include pre-visit review of records, face-to-face time with the patient discussing conditions above, post visit ordering of testing, clinical documentation with the electronic health record, making appropriate referrals as documented, and communicating necessary findings to members of the patients care team.  Roetta Clarke, NP 01/14/2024  Pt aware and understands NP's role.

## 2024-01-14 NOTE — Assessment & Plan Note (Signed)
 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 Reviewed risks of untreated severe sleep apnea and potential treatment options. Completed in lab titration study and was well controlled on CPAP 14 cmH2O without central emergences. Tolerated well with EPR at level 3.  At his last visit, adjusted him to a set pressure of 14 cmH2O, which improved his overall residual AHI to 5.5 rom 9.6. Still having moderate to large leaks. Seems to do better with hybrid full face but Randene Bustard is uncomfortable. I also think the large may be too big. Provided him with AirFit F40 sample mask - sized to a medium. Advised to monitor and notify if he's still having difficulties. Receives benefit when leaks don't disrupt his sleep. Aware of proper care/use. Excellent compliance so far. Safe driving practices reviewed.   Patient Instructions  Continue to use CPAP every night, minimum of 4-6 hours a night.  Change equipment as directed. Wash your tubing with warm soap and water daily, hang to dry. Wash humidifier portion weekly. Use bottled, distilled water and change daily Be aware of reduced alertness and do not drive or operate heavy machinery if experiencing this or drowsiness.  Exercise encouraged, as tolerated. Healthy weight management discussed.  Avoid or decrease alcohol consumption and medications that make you more sleepy, if possible. Notify if persistent daytime sleepiness occurs even with consistent use of PAP therapy.  Try the new mask I gave you   Follow up in 4 weeks via virtual visit with Katie Annalena Piatt,NP. If symptoms do not improve or worsen, please contact office for sooner follow up

## 2024-01-14 NOTE — Patient Instructions (Signed)
 Continue to use CPAP every night, minimum of 4-6 hours a night.  Change equipment as directed. Wash your tubing with warm soap and water daily, hang to dry. Wash humidifier portion weekly. Use bottled, distilled water and change daily Be aware of reduced alertness and do not drive or operate heavy machinery if experiencing this or drowsiness.  Exercise encouraged, as tolerated. Healthy weight management discussed.  Avoid or decrease alcohol consumption and medications that make you more sleepy, if possible. Notify if persistent daytime sleepiness occurs even with consistent use of PAP therapy.  Try the new mask I gave you   Follow up in 4 weeks via virtual visit with Katie Rayme Bui,NP. If symptoms do not improve or worsen, please contact office for sooner follow up

## 2024-01-16 ENCOUNTER — Other Ambulatory Visit: Payer: Self-pay | Admitting: Physician Assistant

## 2024-02-04 DIAGNOSIS — G4733 Obstructive sleep apnea (adult) (pediatric): Secondary | ICD-10-CM | POA: Diagnosis not present

## 2024-02-11 ENCOUNTER — Telehealth: Admitting: Nurse Practitioner

## 2024-02-11 ENCOUNTER — Encounter: Payer: Self-pay | Admitting: Nurse Practitioner

## 2024-02-11 DIAGNOSIS — G4739 Other sleep apnea: Secondary | ICD-10-CM | POA: Diagnosis not present

## 2024-02-11 NOTE — Progress Notes (Signed)
 Patient ID: Carlos Suarez, male     DOB: 1945-10-01, 78 y.o.      MRN: [id number]  No chief complaint on file.   Virtual Visit via Video Note  I connected with Carlos Suarez on 02/11/24 at  3:30 PM EDT by a video enabled telemedicine application and verified that I am speaking with the correct person using two identifiers.  Location: Patient: Home Provider: Office   I discussed the limitations of evaluation and management by telemedicine and the availability of in person appointments. The patient expressed understanding and agreed to proceed.  History of Present Illness: 78 year old male, never smoker followed for sleep apnea.  Past medical history significant for CAD, cardiomyopathy, frequent PVCs, trigeminal neuralgia, HLD.   TEST/EVENTS:  06/12/2023 HST: AHI 55.3/h, SpO2 low 59% 10/29/2023 CPAP titration >> optimal pressure 14 cmH2O with airfit F20 full face mask,  EPR at level 3   06/07/2023:OV with Retta Pitcher NP for sleep consult, referred by Dr. Schuyler Custard.  His wife is with him today who helps provide some of his history.  She has sleep apnea and is concerned that he also does. Loud snoring, witnessed apneas by his wife. He has had issues with reflux in the past. Has woken up gasping for air when these were happening but has not occurred more recently. Dry mouth during the night.  He is not sure if he is more tired during the day or if it is the current medications that he is on. He's recently been started on gabapentin  and baclofen  for trigeminal neuralgia.  Feels okay when he wakes up in the morning.  Does have some restlessness when he sleeps at night.  Denies any drowsy driving, morning headaches, sleep parasomnia/paralysis.  No history of narcolepsy or symptoms of cataplexy. He goes to bed between 10 PM and midnight.  Usually falls asleep relatively quickly.  They have an older dog who tends to wake him up at night; otherwise, he is usually up 1-2 times a night to use the restroom.  Gets  up around 7 to 9 AM in the morning.  He is retired.  Weight is down 5 to 8 pounds over the last 2 years.  Never had a previous sleep study.  Does not currently wear CPAP or oxygen. He has a history of hypertension, PVCs and cardiomyopathy.  No history of stroke or diabetes.  He follows with cardiology.  He had a stent placed in January 2023. He is a never smoker.  Does not drink alcohol.  No excessive caffeine intake.  No sleep aids. Lives with his wife of 54 years.  Has adult children.  Family history of brother with asthma. 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  His central events made up 32% of his overall apneic events.  He feels unchanged compared to his last visit.  He did have some issues the third night of his study where the cannula and finger probe both came off.  He was not sure if this affected the results of his study.  He does not have any issues with drowsy driving, morning headaches or sleep parasomnia/paralysis.  Wants to discuss treatment options today.   11/17/2023: OV with Lashelle Koy NP for follow up after undergoing CPAP titration. He was optimally controlled on setting of 14 cmH2O with an EPR of level 3. He was tested on  a F20 full face mask, which is what he is currently wearing at home; although, he has to switch back and forth between this and the [facility] due to the pressure on the bridge of his nose. He also likes to put his CPAP on and read some before he falls asleep, which he can't do with the full face over the bridge of his nose. He finds the harder plastic on the [place] presses into his nose, which can be uncomfortable.  Otherwise, he is wearing his CPAP nightly. He does feel like it is helping with his sleep. Feels more restful. Not snoring with the CPAP. Denies drowsy driving or sleep parasomnias.  10/18/2023-11/16/2023 CPAP 5-20 cmH2O 28/30  days; 93% >4 hr; average use 8 hr 4 min Pressure 95th 12.1 Leaks 95th 43.8 AHI 9.6   01/14/2024: OV with Juanmanuel Marohl NP for follow-up.  At his last visit, we adjusted his CPAP pressures to a set pressure of 14 cmH2O.  He is still alternating back-and-forth between the [facility] fullface mask and the AirFit F20 in size large.  The AirFit F20 is more comfortable but has a lot of leaks and he cannot wear his glasses with it.  The Randene Bustard has a plastic piece that tends to present to his face and he finds it uncomfortable.  It does tend to fit better and not leak as much.  When he has a night with a good seal, he does feel like he sleeps better and sleep is more restful.  The nights that he is having to fight with his mask fit and it is leaking more, he does not really notice a difference.  Not snoring with CPAP.  Denies drowsy driving or sleep parasomnias. 12/14/2023-01/12/2024 CPAP 14 cmH2O 30/30 days; 100% >4 hr; average use 7 hr 48 m Leaks 95th 59.4 AHI 5.5  02/11/2024: Today - follow up Discussed the use of AI scribe software for clinical note transcription with the patient, who gave verbal consent to proceed.  History of Present Illness   Carlos Suarez is a 78 year old male with sleep apnea who presents for follow-up regarding CPAP therapy.  New CPAP mask fits comfortably. Still having issues with leaks throughout the night.  His sleep is more restful, but he experiences interruptions when the machine increases pressure, requiring him to turn it off and on again to relieve the high pressure setting. The pressure starts at four, which is comfortable, but increases to fourteen, causing discomfort and making him feel 'inflated'.  Energy levels are better during the day. No issues with drowsy driving. Not snoring with CPAP.      01/11/2024-02/09/2024 CPAP 14 cmH2O 30/30 days; 100% >4 hr; average use 7 hr 49 min Leaks 95th 75.1 AHI 5.8  Allergies  Allergen Reactions   Fluothane [Halothane] Nausea And  Vomiting   Oxycodone Other (See Comments)    confusion   Oxycodone Hcl Other (See Comments)    There is no immunization history on file for this patient. Past Medical History:  Diagnosis Date   Family history of breast cancer in male    Family history of pancreatic cancer    Legally blind in right eye, as defined in [place]      Tobacco History: Social History   Tobacco Use  Smoking Status Never  Smokeless Tobacco Never   Counseling given: Not Answered   Outpatient Medications Prior to Visit  Medication Sig Dispense Refill   aspirin  EC 81 MG tablet Take 81 mg by mouth daily. Swallow  whole.     baclofen  (LIORESAL ) 10 MG tablet Take 1 tablet (10 mg total) by mouth 2 (two) times daily. 60 each 11   carbamazepine  (TEGRETOL ) 200 MG tablet TAKE ONE TABLET AT NIGHT FOR 1 WEEK THEN INCREASE TO ONE TABLET TWICE A DAY 60 tablet 11   ezetimibe  (ZETIA ) 10 MG tablet      gabapentin  (NEURONTIN ) 300 MG capsule Take 3 capsules (900 mg total) by mouth 3 (three) times daily. 2700 mg daily 270 capsule 11   metoprolol  succinate (TOPROL -XL) 50 MG 24 hr tablet TAKE 1 TABLET BY MOUTH EVERY DAY 90 tablet 2   nitroGLYCERIN  (NITROSTAT ) 0.4 MG SL tablet Place 1 tablet (0.4 mg total) under the tongue every 5 (five) minutes as needed for chest pain. 25 tablet 0   rosuvastatin  (CRESTOR ) 40 MG tablet TAKE 1 TABLET BY MOUTH EVERY DAY 90 tablet 2   No facility-administered medications prior to visit.     Review of Systems:   Constitutional: No weight change, night sweats, fevers, chills, or lassitude. +improved daytime fatigue  HEENT: No headaches, difficulty swallowing, tooth/dental problems, or sore throat. No sneezing, itching, ear ache,morning nasal congestion, dry mouth at night  CV:  No chest pain, orthopnea, PND, swelling in lower extremities, anasarca, dizziness, palpitations, syncope Resp: +snoring, witnessed apneas (resolves with CPAP); baseline shortness of breath with exertion. No cough.  GI:  No  heartburn, indigestion GU: No nocturia Skin: No rash, lesions, ulcerations MSK:  No joint pain or swelling.   Neuro: No dizziness or lightheadedness.  Psych: No depression or anxiety. Mood stable. +sleep disturbance (improved)  Observations/Objective: Patient is well-developed, well-nourished in no acute distress. A&Ox3. Resting comfortably at home. Unlabored breathing. Speech is clear and coherent with logical content.    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 Understands risks of untreated severe sleep apnea and potential treatment options. Completed in lab titration study and was well controlled on CPAP 14 cmH2O without central emergences. Tolerated well with EPR at level 3.  Previously adjusted him to a set pressure of 14 cmH2O from auto 5-20 cmH2O, which improved his overall residual AHI to 5.5 rom 9.6. Was still having mask leaks so changed his mask to medium AirFit F40 FFM. He likes this mask. Unfortunately, still having leaks and sometimes difficulties with pressure tolerance. Will switch him back to auto setting but tighten settings to 10-14 cmH2O. Well controlled at current settings. Advised to monitor and notify if he's still having difficulties. Receives benefit. Aware of proper care/use. Excellent compliance so far. Safe driving practices reviewed.   Patient Instructions  Continue to use CPAP every night, minimum of 4-6 hours a night.  Change equipment as directed. Wash your tubing with warm soap and water daily, hang to dry. Wash humidifier portion weekly. Use bottled, distilled water and change daily Be aware of reduced alertness and do not drive or operate heavy machinery if experiencing this or drowsiness.  Exercise encouraged, as tolerated. Healthy weight management discussed.  Avoid or decrease alcohol consumption and medications that make you more sleepy, if possible. Notify if persistent  daytime sleepiness occurs even with consistent use of PAP therapy.   Since you're feeling like it's blowing really hard at times and waking you up, I am going to reset your CPAP to an auto setting and adjust the settings to 10-14 cmH2O. This may help with the leaks you are having as well. I will follow up with  you in about 4 weeks to see how this adjustment is working.  If you feel like you're not sleeping as well or the pressures aren't high enough with this change, call and let me know    Follow up in 6 months with Katie Juelle Dickmann,NP. If symptoms do not improve or worsen, please contact office for sooner follow up      I discussed the assessment and treatment plan with the patient. The patient was provided an opportunity to ask questions and all were answered. The patient agreed with the plan and demonstrated an understanding of the instructions.   The patient was advised to call back or seek an in-person evaluation if the symptoms worsen or if the condition fails to improve as anticipated.  I provided 32 minutes of non-face-to-face time during this encounter.   Roetta Clarke, NP

## 2024-02-11 NOTE — Patient Instructions (Addendum)
 Continue to use CPAP every night, minimum of 4-6 hours a night.  Change equipment as directed. Wash your tubing with warm soap and water daily, hang to dry. Wash humidifier portion weekly. Use bottled, distilled water and change daily Be aware of reduced alertness and do not drive or operate heavy machinery if experiencing this or drowsiness.  Exercise encouraged, as tolerated. Healthy weight management discussed.  Avoid or decrease alcohol consumption and medications that make you more sleepy, if possible. Notify if persistent daytime sleepiness occurs even with consistent use of PAP therapy.   Since you're feeling like it's blowing really hard at times and waking you up, I am going to reset your CPAP to an auto setting and adjust the settings to 10-14 cmH2O. This may help with the leaks you are having as well. I will follow up with you in about 4 weeks to see how this adjustment is working.  If you feel like you're not sleeping as well or the pressures aren't high enough with this change, call and let me know    Follow up in 6 months with Katie Yariana Hoaglund,NP. If symptoms do not improve or worsen, please contact office for sooner follow up

## 2024-02-11 NOTE — Assessment & Plan Note (Signed)
 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 Understands risks of untreated severe sleep apnea and potential treatment options. Completed in lab titration study and was well controlled on CPAP 14 cmH2O without central emergences. Tolerated well with EPR at level 3.  Previously adjusted him to a set pressure of 14 cmH2O from auto 5-20 cmH2O, which improved his overall residual AHI to 5.5 rom 9.6. Was still having mask leaks so changed his mask to medium AirFit F40 FFM. He likes this mask. Unfortunately, still having leaks and sometimes difficulties with pressure tolerance. Will switch him back to auto setting but tighten settings to 10-14 cmH2O. Well controlled at current settings. Advised to monitor and notify if he's still having difficulties. Receives benefit. Aware of proper care/use. Excellent compliance so far. Safe driving practices reviewed.   Patient Instructions  Continue to use CPAP every night, minimum of 4-6 hours a night.  Change equipment as directed. Wash your tubing with warm soap and water daily, hang to dry. Wash humidifier portion weekly. Use bottled, distilled water and change daily Be aware of reduced alertness and do not drive or operate heavy machinery if experiencing this or drowsiness.  Exercise encouraged, as tolerated. Healthy weight management discussed.  Avoid or decrease alcohol consumption and medications that make you more sleepy, if possible. Notify if persistent daytime sleepiness occurs even with consistent use of PAP therapy.   Since you're feeling like it's blowing really hard at times and waking you up, I am going to reset your CPAP to an auto setting and adjust the settings to 10-14 cmH2O. This may help with the leaks you are having as well. I will follow up with you in about 4 weeks to see how this adjustment is working.  If you feel like you're not sleeping as well or the pressures aren't high enough with this change, call  and let me know    Follow up in 6 months with Katie Ainsleigh Kakos,NP. If symptoms do not improve or worsen, please contact office for sooner follow up

## 2024-02-16 DIAGNOSIS — Z125 Encounter for screening for malignant neoplasm of prostate: Secondary | ICD-10-CM | POA: Diagnosis not present

## 2024-02-16 DIAGNOSIS — Z0000 Encounter for general adult medical examination without abnormal findings: Secondary | ICD-10-CM | POA: Diagnosis not present

## 2024-02-16 DIAGNOSIS — E7800 Pure hypercholesterolemia, unspecified: Secondary | ICD-10-CM | POA: Diagnosis not present

## 2024-03-01 DIAGNOSIS — G4733 Obstructive sleep apnea (adult) (pediatric): Secondary | ICD-10-CM | POA: Diagnosis not present

## 2024-03-05 DIAGNOSIS — G4733 Obstructive sleep apnea (adult) (pediatric): Secondary | ICD-10-CM | POA: Diagnosis not present

## 2024-03-06 DIAGNOSIS — Z0000 Encounter for general adult medical examination without abnormal findings: Secondary | ICD-10-CM | POA: Diagnosis not present

## 2024-03-06 DIAGNOSIS — I420 Dilated cardiomyopathy: Secondary | ICD-10-CM | POA: Diagnosis not present

## 2024-03-06 DIAGNOSIS — M546 Pain in thoracic spine: Secondary | ICD-10-CM | POA: Diagnosis not present

## 2024-03-06 DIAGNOSIS — G4739 Other sleep apnea: Secondary | ICD-10-CM | POA: Diagnosis not present

## 2024-03-06 DIAGNOSIS — G500 Trigeminal neuralgia: Secondary | ICD-10-CM | POA: Diagnosis not present

## 2024-03-06 DIAGNOSIS — I2510 Atherosclerotic heart disease of native coronary artery without angina pectoris: Secondary | ICD-10-CM | POA: Diagnosis not present

## 2024-03-06 DIAGNOSIS — G8929 Other chronic pain: Secondary | ICD-10-CM | POA: Diagnosis not present

## 2024-03-06 DIAGNOSIS — I493 Ventricular premature depolarization: Secondary | ICD-10-CM | POA: Diagnosis not present

## 2024-03-06 DIAGNOSIS — R5383 Other fatigue: Secondary | ICD-10-CM | POA: Diagnosis not present

## 2024-03-06 DIAGNOSIS — I700 Atherosclerosis of aorta: Secondary | ICD-10-CM | POA: Diagnosis not present

## 2024-03-08 DIAGNOSIS — M4850XA Collapsed vertebra, not elsewhere classified, site unspecified, initial encounter for fracture: Secondary | ICD-10-CM | POA: Diagnosis not present

## 2024-03-14 NOTE — Progress Notes (Unsigned)
    Ben Jackson D.[facility] [address] [place] [address] Phone: [phone]   Assessment and Plan:     There are no diagnoses linked to this encounter.  ***   Pertinent previous records reviewed include ***    Follow Up: ***     Subjective:   I, Shishir Krantz, am serving as a Neurosurgeon for Doctor Morene Mace  Chief Complaint: back pain   HPI:   03/15/2024 Patient is a 78 year old male with back pain. Patient states   Relevant Historical Information: ***  Additional pertinent review of systems negative.   Current Outpatient Medications:    aspirin  EC 81 MG tablet, Take 81 mg by mouth daily. Swallow whole., Disp: , Rfl:    baclofen  (LIORESAL ) 10 MG tablet, Take 1 tablet (10 mg total) by mouth 2 (two) times daily., Disp: 60 each, Rfl: 11   carbamazepine  (TEGRETOL ) 200 MG tablet, TAKE ONE TABLET AT NIGHT FOR 1 WEEK THEN INCREASE TO ONE TABLET TWICE A DAY, Disp: 60 tablet, Rfl: 11   ezetimibe  (ZETIA ) 10 MG tablet, , Disp: , Rfl:    gabapentin  (NEURONTIN ) 300 MG capsule, Take 3 capsules (900 mg total) by mouth 3 (three) times daily. 2700 mg daily, Disp: 270 capsule, Rfl: 11   metoprolol  succinate (TOPROL -XL) 50 MG 24 hr tablet, TAKE 1 TABLET BY MOUTH EVERY DAY, Disp: 90 tablet, Rfl: 2   nitroGLYCERIN  (NITROSTAT ) 0.4 MG SL tablet, Place 1 tablet (0.4 mg total) under the tongue every 5 (five) minutes as needed for chest pain., Disp: 25 tablet, Rfl: 0   rosuvastatin  (CRESTOR ) 40 MG tablet, TAKE 1 TABLET BY MOUTH EVERY DAY, Disp: 90 tablet, Rfl: 2   Objective:     There were no vitals filed for this visit.    There is no height or weight on file to calculate BMI.    Physical Exam:    ***   Electronically signed by:  Odis Mace D.[facility] 7:44 AM 03/14/24

## 2024-03-15 ENCOUNTER — Ambulatory Visit: Admitting: Sports Medicine

## 2024-03-15 VITALS — HR 69 | Ht 73.0 in | Wt 258.0 lb

## 2024-03-15 DIAGNOSIS — M419 Scoliosis, unspecified: Secondary | ICD-10-CM

## 2024-03-15 DIAGNOSIS — R002 Palpitations: Secondary | ICD-10-CM | POA: Diagnosis not present

## 2024-03-15 DIAGNOSIS — M546 Pain in thoracic spine: Secondary | ICD-10-CM

## 2024-03-15 DIAGNOSIS — M810 Age-related osteoporosis without current pathological fracture: Secondary | ICD-10-CM | POA: Diagnosis not present

## 2024-03-15 DIAGNOSIS — G8929 Other chronic pain: Secondary | ICD-10-CM | POA: Diagnosis not present

## 2024-03-15 NOTE — Patient Instructions (Signed)
 Tylenol  [phone] mg 2-3 times a day for pain relief  NSAIDs as needed for breakthrough pain limit 1-2 doses per week Thoracic HEP  8 week follow up

## 2024-03-23 NOTE — Telephone Encounter (Signed)
 Please schedule patient per last office visit

## 2024-04-05 DIAGNOSIS — G4733 Obstructive sleep apnea (adult) (pediatric): Secondary | ICD-10-CM | POA: Diagnosis not present

## 2024-04-05 DIAGNOSIS — R002 Palpitations: Secondary | ICD-10-CM | POA: Diagnosis not present

## 2024-04-05 DIAGNOSIS — I493 Ventricular premature depolarization: Secondary | ICD-10-CM | POA: Diagnosis not present

## 2024-04-05 NOTE — Telephone Encounter (Signed)
 SABRA

## 2024-04-06 DIAGNOSIS — I493 Ventricular premature depolarization: Secondary | ICD-10-CM | POA: Diagnosis not present

## 2024-04-06 DIAGNOSIS — M8000XD Age-related osteoporosis with current pathological fracture, unspecified site, subsequent encounter for fracture with routine healing: Secondary | ICD-10-CM | POA: Diagnosis not present

## 2024-04-07 DIAGNOSIS — R002 Palpitations: Secondary | ICD-10-CM | POA: Diagnosis not present

## 2024-04-10 ENCOUNTER — Other Ambulatory Visit: Payer: Self-pay

## 2024-04-10 MED ORDER — PROLIA 60 MG/ML ~~LOC~~ SOSY
PREFILLED_SYRINGE | SUBCUTANEOUS | 1 refills | Status: AC
  Filled 2024-04-19: qty 1, 180d supply, fill #0

## 2024-04-11 ENCOUNTER — Other Ambulatory Visit: Payer: Self-pay

## 2024-04-11 NOTE — Progress Notes (Signed)
 Pharmacy Patient Advocate Encounter  Insurance verification completed.   The patient is insured through [employer]   Ran test claim for Prolia. Co-pay is $64.  This test claim was processed through [facility]- copay amounts may vary at other pharmacies due to pharmacy/plan contracts, or as the patient moves through the different stages of their insurance plan.

## 2024-04-12 ENCOUNTER — Other Ambulatory Visit: Payer: Self-pay

## 2024-04-13 ENCOUNTER — Other Ambulatory Visit: Payer: Self-pay | Admitting: Cardiovascular Disease

## 2024-04-14 ENCOUNTER — Other Ambulatory Visit: Payer: Self-pay

## 2024-04-18 ENCOUNTER — Ambulatory Visit: Attending: Cardiovascular Disease | Admitting: Cardiovascular Disease

## 2024-04-18 VITALS — BP 126/70 | HR 83 | Ht 72.0 in | Wt 253.0 lb

## 2024-04-18 DIAGNOSIS — I420 Dilated cardiomyopathy: Secondary | ICD-10-CM

## 2024-04-18 DIAGNOSIS — I2510 Atherosclerotic heart disease of native coronary artery without angina pectoris: Secondary | ICD-10-CM

## 2024-04-18 DIAGNOSIS — I454 Nonspecific intraventricular block: Secondary | ICD-10-CM

## 2024-04-18 DIAGNOSIS — I341 Nonrheumatic mitral (valve) prolapse: Secondary | ICD-10-CM | POA: Diagnosis not present

## 2024-04-18 DIAGNOSIS — I472 Ventricular tachycardia, unspecified: Secondary | ICD-10-CM

## 2024-04-18 MED ORDER — METOPROLOL SUCCINATE ER 50 MG PO TB24: ORAL_TABLET | ORAL | 3 refills | Status: DC

## 2024-04-18 NOTE — Patient Instructions (Signed)
 Medication Instructions:  Increase Metoprolol  Succinate Take 1&1/2 tablets ( 75 mg ) daily Continue all other medications *If you need a refill on your cardiac medications before your next appointment, please call your pharmacy*  Lab Work: None ordered  Testing/Procedures: None ordered  Follow-Up: At [facility], you and your health needs are our priority.  As part of our continuing mission to provide you with exceptional heart care, our providers are all part of one team.  This team includes your primary Cardiologist (physician) and Advanced Practice Providers or APPs (Physician Assistants and Nurse Practitioners) who all work together to provide you with the care you need, when you need it.  Your next appointment:  Monday 9/29 at 9:20 am    Provider:  Dr.Croitoru    We recommend signing up for the patient portal called MyChart.  Sign up information is provided on this After Visit Summary.  MyChart is used to connect with patients for Virtual Visits (Telemedicine).  Patients are able to view lab/test results, encounter notes, upcoming appointments, etc.  Non-urgent messages can be sent to your provider as well.   To learn more about what you can do with MyChart, go to [email or web address].

## 2024-04-19 ENCOUNTER — Other Ambulatory Visit: Payer: Self-pay

## 2024-04-19 NOTE — Progress Notes (Signed)
 Specialty Pharmacy Initial Fill Coordination Note  Carlos Suarez is a 78 y.o. male contacted today regarding initial fill of specialty medication(s) Denosumab  (Prolia )   Patient requested Courier to Provider Office   Delivery date: 05/18/24   Verified address: [facility] [address]   Medication will be filled on 9/17.   Patient is aware of $64 copayment.

## 2024-04-20 ENCOUNTER — Encounter: Payer: Self-pay | Admitting: Cardiovascular Disease

## 2024-04-20 DIAGNOSIS — I472 Ventricular tachycardia, unspecified: Secondary | ICD-10-CM | POA: Insufficient documentation

## 2024-04-20 NOTE — Progress Notes (Signed)
 Cardiology Office Note:    Date:  04/20/2024   ID:  Carlos Suarez, DOB October 04, 1945, MRN [id number]  PCP:  Clarice Nottingham, MD   [facility] Providers Cardiologist:  Jerel Balding, MD Electrophysiologist:  Fonda Kitty, MD     Referring MD: Clarice Nottingham, MD   No chief complaint on file.   History of Present Illness:    Carlos Suarez is a 78 y.o. 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 Due to an abnormal stress test and dilated LV with decreased LVEF (45-50% on echo, 40% QGS), he underwent cardiac catheterization that showed a 85% mid RCA stenosis that was stented (Synergy DES 4.0x 18 Sep 10, 2021), also noted to have tandem 50-60% ramus intermedius artery stenoses. Follow up echo in May 2023 showed no improvement in LV function.    Previous treatment with amiodarone  for his ventricular arrhythmia was stopped due to a pruritic erythematous rash (he had been started on 4 other medications around that same time: Jardiance , metoprolol , clopidogrel , ezetimibe ).  He is referred back by Dr. Clarice after his outpatient 14-day arrhythmia monitor showed frequent PVCs and VT.  He had a high burden of PVCs representing almost 12 % of all recorded beats, including about 1000 ventricular triplets during the 2-week recording.  The longest episode of wide-complex tachycardia was only 7 beats in duration, but there did seem to be symptoms associated with some of the episodes of VT.  The episodes of PVCs and VT were clearly much more prevalent during daytime hours than during the night, suggesting a correlation with physical activity.  Also noted were a handful of brief episodes of ectopic atrial tachycardia, nonsustained, likely not clinically important.  There were no episodes of significant  bradycardia, even though his smart watch has reported the heart rates in the 40s (I suspect this was pulse deficit due to irregular rhythm).  He is not really troubled by palpitations, but has noticed random episodes of brief dizziness occurring at rest.  He has not experienced syncope.  He has not had problems with shortness of breath.  His wearable rhythm monitoring device has occasionally reported periods of bradycardia with heart rates in the 40s, but I suspect that these may actually be due to under counting of periods of ventricular bigeminy or other frequent PVCs.  ECG today shows sinus rhythm with very frequent PVCs with somewhat varying morphologies, although they all have a right bundle branch block pattern, suggesting left ventricular origin.  He has a pre-existing nonspecific intraventricular conduction delay with a QRS duration of 128 ms, left axis deviation, most closely resembling an atypical LBBB with an initial Q wave in lead aVL.  During stress testing and during the heart catheterization he had very frequent PVCs. Amiodarone  was initiated at the time of cardiac cath, when he was also started on clopidogrel  (for 6 months), Jardiance  and metoprolol . Zetia  was started relatively recently as well.  He developed a pruritic, slightly raised erythematous rash after starting amiodarone , ezetimibe  and Jardiance , that resolved after discontinuation of these medication.  Amiodarone  and Jardiance  have been discontinued.  He has continued on metoprolol  is now back on ezetimibe  and this has not caused any rashes.  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  There is late systolic mild-moderate mitral insufficiency.  LVEF indeed remains mildly depressed around 45%, but the pattern appears to be global.  He is scheduled for another echocardiogram on  September 3, 202 55.  He is also scheduled for an appointment with Dr. Fonda Kitty on 04/25/2024.     Past Medical History:  Diagnosis Date   Family history of breast cancer in male    Family history of pancreatic cancer    Legally blind in right eye, as defined in [place]      Past Surgical History:  Procedure Laterality Date   CORONARY STENT INTERVENTION N/A 09/10/2021   Procedure: CORONARY STENT INTERVENTION;  Surgeon: Anner Alm ORN, MD;  Location: [facility] INVASIVE CV LAB;  Service: Cardiovascular;  Laterality: N/A;   LEFT HEART CATH AND CORONARY ANGIOGRAPHY N/A 09/10/2021   Procedure: LEFT HEART CATH AND CORONARY ANGIOGRAPHY;  Surgeon: Anner Alm ORN, MD;  Location: [facility] INVASIVE CV LAB;  Service: Cardiovascular;  Laterality: N/A;    Current Medications: Current Meds  Medication Sig   aspirin  EC 81 MG tablet Take 81 mg by mouth daily. Swallow whole.   baclofen  (LIORESAL ) 10 MG tablet Take 1 tablet (10 mg total) by mouth 2 (two) times daily.   ezetimibe  (ZETIA ) 10 MG tablet    gabapentin  (NEURONTIN ) 300 MG capsule Take 3 capsules (900 mg total) by mouth 3 (three) times daily. 2700 mg daily   metoprolol  succinate (TOPROL -XL) 50 MG 24 hr tablet Take 1&1/2 tablets ( 75 mg ) daily   nitroGLYCERIN  (NITROSTAT ) 0.4 MG SL tablet Place 1 tablet (0.4 mg total) under the tongue every 5 (five) minutes as needed for chest pain.   rosuvastatin  (CRESTOR ) 40 MG tablet TAKE 1 TABLET BY MOUTH EVERY DAY   [DISCONTINUED] metoprolol  succinate (TOPROL -XL) 50 MG 24 hr tablet TAKE 1 TABLET BY MOUTH EVERY DAY     Allergies:   Fluothane [halothane], Oxycodone, and Oxycodone hcl   Family History: The patient's only history is significant for the absence of early onset CAD or CHF, known cardiomyopathy or unexplained premature death  ROS:   Please see the history of present illness.     All other systems reviewed and are negative.  EKGs/Labs/Other Studies Reviewed:   Cardiac catheterization   Mid RCA  lesion is 85% stenosed.   A drug-eluting stent was successfully placed using a STENT ONYX FRONTIER 4.0X18.   Post intervention, there is a 0% residual stenosis.   --------------------------------------------   Dist LAD lesion is 45% stenosed.   Ramus-1 lesion is 50% stenosed. Ramus-2 lesion is 50% stenosed.   --------------------------------------------   There is mild to moderate left ventricular systolic dysfunction.  The left ventricular ejection fraction is 35-45% by visual estimate.   LV end diastolic pressure is normal.   There is no aortic valve stenosis.   SUMMARY Diffuse coronary disease with severe single-vessel disease, mid RCA 85% stenosis, ramus intermedius tandem 50 to 60% stenoses Successful DES PCI of mid RCA focal 85% stenosis - reducing to 0%: Synergy DES 4.0 mm x 18 mm deployed to 4.1 mm TIMI-3 flow pre and post Mildly reduced LVEF of roughly 40 to 45%-inferior hypokinesis. Normal LVEDP     RECOMMENDATIONS Monitor post PCI overnight.  Anticipate morning discharge Start low-dose Toprol , increase statin to 40 mg Based on this of significance of PVCs and reduced EF, will load with amiodarone  4 mg twice daily with plan for 1 week followed by 20 mg twice daily for  1 week and then 200 mg daily. Uninterrupted DAPT (CLOPIDOGREL  75 MG, ASPIRIN  81 MG) X 6 months min   The following studies were reviewed today: 08/06/2021 pharmacological nuclear perfusion study   Findings are consistent with no prior ischemia and no prior myocardial infarction. The study is intermediate risk.   No ST deviation was noted.   LV perfusion is normal.   Left ventricular function is abnormal. Global function is moderately reduced. Nuclear stress EF: 40 %. The left ventricular ejection fraction is moderately decreased (30-44%). End diastolic cavity size is mildly enlarged.   Prior study not available for comparison.   Intermediate risk study with normal myocardial perfusion, but with a dilated left  ventricle and moderately reduced global systolic function. Findings suggest nonischemic dilated cardiomyopathy, but gated images could be causing artifactual underestimation of left ventricular function due to frequent PVCs. Recommend correlation with echocardiogram.  Echocardiogram 12/29/2020   1. Left ventricular ejection fraction, by estimation, is 45 to 50%. The left ventricle has mildly decreased function. The left ventricle demonstrates regional wall motion abnormalities (abnormal septal motion). Left ventricular diastolic parameters are consistent with Grade I diastolic dysfunction (impaired relaxation).   2. Right ventricular systolic function is normal. The right ventricular size is normal. There is normal pulmonary artery systolic pressure. The estimated right ventricular systolic pressure is 21.1 mmHg.   3. The mitral valve is normal in structure. Mild to moderate mitral valve regurgitation. No evidence of mitral stenosis.   4. The aortic valve is tricuspid. Aortic valve regurgitation is mild to moderate.   5. Frequent ectopy.   Comparison(s): Similar to prior, valve regurgitation appears worse during post PVC beats.   Echocardiogram 12/29/2021  1. Left ventricular ejection fraction, by estimation, is 45 to 50%. The  left ventricle has mildly decreased function. The left ventricle  demonstrates regional wall motion abnormalities (abnormal septal motion).  Left ventricular diastolic parameters are  consistent with Grade I diastolic dysfunction (impaired relaxation).   2. Right ventricular systolic function is normal. The right ventricular  size is normal. There is normal pulmonary artery systolic pressure. The  estimated right ventricular systolic pressure is 21.1 mmHg.   3. The mitral valve is normal in structure. Mild to moderate mitral valve  regurgitation. No evidence of mitral stenosis.   4. The aortic valve is tricuspid. Aortic valve regurgitation is mild to  moderate.   5.  Frequent ectopy.   EKG:    EKG Interpretation Date/Time:  Tuesday April 18 2024 08:46:38 EDT Ventricular Rate:  83 PR Interval:  200 QRS Duration:  128 QT Interval:  386 QTC Calculation: 453 R Axis:   -43  Text Interpretation: Sinus rhythm with frequent Premature ventricular complexes Left axis deviation Non-specific intra-ventricular conduction block When compared with ECG of 15-Sep-2023 12:53, Premature ventricular complexes are now Present Abberant conduction is no longer Present Confirmed by Steffen Hase [phone]) on 04/18/2024 9:02:51 AM         Recent Labs: 09/15/2023: ALT 8; B Natriuretic Peptide 78.1; BUN 16; Creatinine, Ser 1.16; Hemoglobin 14.9; Platelets 160; Potassium 3.7; Sodium 138  09/11/2021 Hemoglobin 12.4, creatinine 1.1, potassium 3.9, ALT 7.0, TSH 3.9 Recent Lipid Panel No results found for: CHOL, TRIG, HDL, CHOLHDL, VLDL, LDLCALC, LDLDIRECT 04/05/2023 Cholesterol 132, HDL 51, LDL 61, triglycerides 111  Risk Assessment/Calculations:            Physical Exam:    VS:  BP 126/70 (BP Location: Right Arm, Cuff Size: Large)   Pulse 83  Wt Readings from Last 3 Encounters:  03/15/24 258 lb (117 kg)  01/14/24 254 lb 6.4 oz (115.4 kg)  11/29/23 257 lb (116.6 kg)     General: Alert, oriented x3, no distress, appears well. Head: Opacified right cornea, no exophtalmos or lid lag, no myxedema, no xanthelasma; normal ears, nose and oropharynx Neck: normal jugular venous pulsations and no hepatojugular reflux; brisk carotid pulses without delay and no carotid bruits Chest: clear to auscultation, no signs of consolidation by percussion or palpation, normal fremitus, symmetrical and full respiratory excursions Cardiovascular: normal position and quality of the apical impulse, regular rhythm with frequent superimposed ectopy, normal first and second heart sounds, 1/6 late systolic murmur heard at the apex only, no diastolic murmurs, rubs or  gallops Abdomen: no tenderness or distention, no masses by palpation, no abnormal pulsatility or arterial bruits, normal bowel sounds, no hepatosplenomegaly Extremities: no clubbing, cyanosis or edema; 2+ radial, ulnar and brachial pulses bilaterally; 2+ right femoral, posterior tibial and dorsalis pedis pulses; 2+ left femoral, posterior tibial and dorsalis pedis pulses; no subclavian or femoral bruits Neurological: grossly nonfocal Psych: Normal mood and affect    ASSESSMENT:    1. Coronary artery disease involving native coronary artery of native heart without angina pectoris      PLAN:    In order of problems listed above:  PVCs/NSVT: Arrhythmia may be an expression of the CMP or the cause of the CMP (latter less likely with an overall burden of only 12%).  PVC morphology suggests left ventricular origin.  Amiodarone  was stopped after he developed a rash, although this could also have been secondary to the Jardiance .  Consider treatment with mexiletine.  Since his appointment with Dr. Kennyth is in just a few days from now, for the time being we will just increase the beta-blocker dose to 75 mg once daily. Cardiomyopathy: Mildly depressed LVEF confirmed by QGS, echo and LV angiography.  No improvement after revascularization.  In part, reduced LVEF may be explained by dyssynchrony related to his IVCD.  We have ordered another echocardiogram, but he may also benefit from a cardiac MRI.  He remains essentially asymptomatic, NYHA functional class I and is euvolemic without diuretics.  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  Currently not on RAAS inhibitors.  Possibly had a rash from SGLT2 inhibitor.  CAD: He has never had angina pectoris.  We assumed that CAD was present based on the depressed LVEF and the pattern of regional inferior wall hypokinesis, but after placement of the right coronary artery stent there was no improvement  in left ventricular systolic function.  On aspirin , lipid-lowering therapy, beta-blocker. Cardiomyopathy appears out of proportion to the extent of the CAD.   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  Unlikely to benefit from CRT unless becomes broader (and of course if he develops clinical HF). MVP: On my review of the echo he has myxomatous leaflets, associated with a mild degree of MR.  Repeat echocardiogram is scheduled in a couple of weeks.   HLP: Target LDL less than 100.  Continue rosuvastatin . OSA: Started on CPAP Rash: Resolved after stopping Jardiance  and amiodarone .  Metoprolol  and clopidogrel  were continued and the rash did not recur.  The exact culprit was unclear.      Medication Adjustments/Labs and Tests Ordered: Current medicines are reviewed at length with the patient today.  Concerns regarding medicines  are outlined above.  Orders Placed This Encounter  Procedures   EKG 12-Lead   Meds ordered this encounter  Medications   metoprolol  succinate (TOPROL -XL) 50 MG 24 hr tablet    Sig: Take 1&1/2 tablets ( 75 mg ) daily    Dispense:  135 tablet    Refill:  3    Patient Instructions  Medication Instructions:  Increase Metoprolol  Succinate Take 1&1/2 tablets ( 75 mg ) daily Continue all other medications *If you need a refill on your cardiac medications before your next appointment, please call your pharmacy*  Lab Work: None ordered  Testing/Procedures: None ordered  Follow-Up: At [facility], you and your health needs are our priority.  As part of our continuing mission to provide you with exceptional heart care, our providers are all part of one team.  This team includes your primary Cardiologist (physician) and Advanced Practice Providers or APPs (Physician Assistants and Nurse Practitioners) who all work together to provide you with the care you need, when you need  it.  Your next appointment:  Monday 9/29 at 9:20 am    Provider:  Dr.Hyla Coard    We recommend signing up for the patient portal called MyChart.  Sign up information is provided on this After Visit Summary.  MyChart is used to connect with patients for Virtual Visits (Telemedicine).  Patients are able to view lab/test results, encounter notes, upcoming appointments, etc.  Non-urgent messages can be sent to your provider as well.   To learn more about what you can do with MyChart, go to [email or web address].       Signed, Jerel Balding, MD  04/20/2024 6:10 PM    [facility]

## 2024-04-24 NOTE — Progress Notes (Signed)
 " Electrophysiology Office Note:   Date:  04/24/2024  ID:  Carlos Suarez, Carlos Suarez 1945/09/03, MRN [id number]  Primary Cardiologist: Jerel Balding, MD Electrophysiologist: Fonda Kitty, MD      History of Present Illness:   Carlos Suarez is a 78 y.o. male with h/o CAD s/p RCA PCI, HLD, OSA, VT previously on amiodarone  but stopped due to rash who is being seen today for abnormal Zio.  Discussed the use of AI scribe software for clinical note transcription with the patient, who gave verbal consent to proceed.  History of Present Illness He experiences episodes of lightheadedness and sensations of near syncope. These symptoms predominantly occur in the mornings and are sometimes associated with shortness of breath. He previously used a pulse oximeter and blood pressure machine, which have shown low heart rates, though he questions their accuracy because since using a Kardia device he has not received any bradycardia alerts.  He has a history of premature ventricular contractions (PVCs), identified during a previous EKG. He recently wore a monitor which showed 10% burden as well as short bursts of NSVT and PSVT. He is currently taking metoprolol , recently increased to 75 mg once daily, which he has tolerated without perceiving much change in symptoms. He has a history of a rash with amiodarone  postoperatively after a stent placement, limiting its use for his condition.   Review of systems complete and found to be negative unless listed in HPI.   EP Information / Studies Reviewed:    EKG is not ordered today. EKG from 04/18/24 reviewed which showed SR with PVCs      Echo 12/2021:   1. Left ventricular ejection fraction, by estimation, is 45 to 50%. The  left ventricle has mildly decreased function. The left ventricle  demonstrates regional wall motion abnormalities (abnormal septal motion).  Left ventricular diastolic parameters are  consistent with Grade I diastolic dysfunction (impaired  relaxation).   2. Right ventricular systolic function is normal. The right ventricular  size is normal. There is normal pulmonary artery systolic pressure. The  estimated right ventricular systolic pressure is 21.1 mmHg.   3. The mitral valve is normal in structure. Mild to moderate mitral valve  regurgitation. No evidence of mitral stenosis.   4. The aortic valve is tricuspid. Aortic valve regurgitation is mild to  moderate.   5. Frequent ectopy.   LHC 08/2021:    Mid RCA lesion is 85% stenosed.   A drug-eluting stent was successfully placed using a STENT ONYX FRONTIER 4.0X18.   Post intervention, there is a 0% residual stenosis.   --------------------------------------------   Dist LAD lesion is 45% stenosed.   Ramus-1 lesion is 50% stenosed. Ramus-2 lesion is 50% stenosed.   --------------------------------------------   There is mild to moderate left ventricular systolic dysfunction.  The left ventricular ejection fraction is 35-45% by visual estimate.   LV end diastolic pressure is normal.   There is no aortic valve stenosis.   SUMMARY Diffuse coronary disease with severe single-vessel disease, mid RCA 85% stenosis, ramus intermedius tandem 50 to 60% stenoses Successful DES PCI of mid RCA focal 85% stenosis - reducing to 0%: Synergy DES 4.0 mm x 18 mm deployed to 4.1 mm TIMI-3 flow pre and post Mildly reduced LVEF of roughly 40 to 45%-inferior hypokinesis. Normal LVEDP  Physical Exam:   VS:  There were no vitals taken for this visit.   Wt Readings from Last 3 Encounters:  04/18/24 253 lb (114.8 kg)  03/15/24 258 lb (117 kg)  01/14/24 254 lb 6.4 oz (115.4 kg)     GEN: Well nourished, well developed in no acute distress NECK: No JVD CARDIAC: Normal rate, irregular RESPIRATORY:  Clear to auscultation without rales, wheezing or rhonchi  ABDOMEN: Soft, non-distended EXTREMITIES:  No edema; No deformity   ASSESSMENT AND PLAN:    #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 I suspect these  are the main drivers of his symptoms based on symptom triggered events on his Zio monitor.  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  -Echocardiogram is pending. Based on results of echocardiogram, can choose anti-arrhythmic accordingly. Not candidate for 1C agent d/t CAD. Likely start mexiletine first, max out metoprolol , and repeat monitor to reassess for improvement. If mexiletine ineffective and EF is >40% then sotalol would be an option. He is willing to retry amiodarone  if necessary. Reported rash on one of his ankles after taking previously. 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 Well compensated on exam. No clinical signs or symptoms of CHF.  -Follow up with Dr. Francyne for management.   #CAD: S/p PCI to RCA. No chest pain.  - Continue aspirin , ezetimibe  and rosuvastatin .   Follow up with Dr. Kennyth in 4 weeks  Signed, Fonda Kennyth, MD  "

## 2024-04-25 ENCOUNTER — Ambulatory Visit: Attending: Cardiology | Admitting: Cardiology

## 2024-04-25 ENCOUNTER — Encounter: Payer: Self-pay | Admitting: Cardiology

## 2024-04-25 VITALS — BP 120/74 | HR 82 | Ht 72.0 in | Wt 246.0 lb

## 2024-04-25 DIAGNOSIS — I4729 Other ventricular tachycardia: Secondary | ICD-10-CM | POA: Diagnosis not present

## 2024-04-25 DIAGNOSIS — I471 Supraventricular tachycardia, unspecified: Secondary | ICD-10-CM | POA: Diagnosis not present

## 2024-04-25 DIAGNOSIS — I2510 Atherosclerotic heart disease of native coronary artery without angina pectoris: Secondary | ICD-10-CM | POA: Diagnosis not present

## 2024-04-25 DIAGNOSIS — I493 Ventricular premature depolarization: Secondary | ICD-10-CM | POA: Diagnosis not present

## 2024-04-25 DIAGNOSIS — I5022 Chronic systolic (congestive) heart failure: Secondary | ICD-10-CM | POA: Diagnosis not present

## 2024-04-25 MED ORDER — METOPROLOL SUCCINATE ER 50 MG PO TB24
50.0000 mg | ORAL_TABLET | Freq: Two times a day (BID) | ORAL | 3 refills | Status: AC
Start: 2024-04-25 — End: ?

## 2024-04-25 NOTE — Patient Instructions (Signed)
 Medication Instructions:  Your physician has recommended you make the following change in your medication:  1) CHANGE metoprolol  succinate (Toprol  XL) 50 mg twice daily  *If you need a refill on your cardiac medications before your next appointment, please call your pharmacy*  Follow-Up: At [facility], you and your health needs are our priority.  As part of our continuing mission to provide you with exceptional heart care, our providers are all part of one team.  This team includes your primary Cardiologist (physician) and Advanced Practice Providers or APPs (Physician Assistants and Nurse Practitioners) who all work together to provide you with the care you need, when you need it.  Your next appointment:   4 weeks  Provider:   Fonda Kitty, MD

## 2024-05-03 ENCOUNTER — Ambulatory Visit (HOSPITAL_COMMUNITY)
Admission: RE | Admit: 2024-05-03 | Discharge: 2024-05-03 | Disposition: A | Discharge status: Home / Self Care | Source: Ambulatory Visit | Attending: Internal Medicine | Admitting: Internal Medicine

## 2024-05-03 DIAGNOSIS — I420 Dilated cardiomyopathy: Secondary | ICD-10-CM | POA: Diagnosis not present

## 2024-05-03 LAB — ECHOCARDIOGRAM COMPLETE
Area-P 1/2: 5.62 cm2
S' Lateral: 3.53 cm

## 2024-05-04 ENCOUNTER — Ambulatory Visit: Payer: Self-pay | Admitting: Cardiovascular Disease

## 2024-05-06 DIAGNOSIS — G4733 Obstructive sleep apnea (adult) (pediatric): Secondary | ICD-10-CM | POA: Diagnosis not present

## 2024-05-10 NOTE — Progress Notes (Unsigned)
 [facility] [address] [place] [address] Phone: [phone]   Assessment and Plan:     1. Chronic right-sided thoracic back pain (Primary) 2. Scoliosis of thoracic spine, unspecified scoliosis type -Chronic with exacerbation, subsequent visit - Overall improvement in upper back pain since starting conservative therapy including HEP, as needed Tylenol /NSAID use.  Still most consistent with mild thoracic scoliosis leading to musculoskeletal dysfunction, right-sided back pain and rib cage pain - Continue HEP for thoracic spine - Use Tylenol  500 to 1000 mg tablets 2-3 times a day for day-to-day pain relief -May use NSAIDs as needed for breakthrough pain.  Recommend limiting chronic NSAIDs to 1-2 doses per week -Patient plans on initiating Prolia  therapy and is continuing treatment for trigeminal neuralgia.  Treatments may affect ongoing back pain.  Pertinent previous records reviewed include none   Follow Up: As needed if no improvement or worsening of symptoms.  Could consider prescription NSAID course versus physical therapy referral   Subjective:   I, [name], am serving as a Neurosurgeon for Doctor Morene Mace   Chief Complaint: back pain    HPI:    03/15/2024 Patient is a 78 year old male with back pain. Patient states pain is located thoracic back and wraps around the front. Intermittent pain depending on movement. 10 days of pain from mowing the lawn. No meds for the pain. No radiating pain.  No pain when taking deep breathes. Decreased ROM    05/11/2024 Patient states he is doing fine   Relevant Historical Information: Trigeminal neuralgia, history of PVCs, CAD, hyperlipidemia  Additional pertinent review of systems negative.   Current Outpatient Medications:    aspirin  EC 81 MG tablet, Take 81 mg by mouth daily. Swallow whole., Disp: , Rfl:    baclofen  (LIORESAL ) 10 MG tablet, Take 1 tablet (10 mg  total) by mouth 2 (two) times daily., Disp: 60 each, Rfl: 11   denosumab  (PROLIA ) 60 MG/ML SOSY injection, 60 mg Subcutaneous every 6 months (Patient not taking: Reported on 04/25/2024), Disp: 1 mL, Rfl: 1   ezetimibe  (ZETIA ) 10 MG tablet, , Disp: , Rfl:    gabapentin  (NEURONTIN ) 300 MG capsule, Take 3 capsules (900 mg total) by mouth 3 (three) times daily. 2700 mg daily, Disp: 270 capsule, Rfl: 11   metoprolol  succinate (TOPROL -XL) 50 MG 24 hr tablet, Take 1 tablet (50 mg total) by mouth in the morning and at bedtime. Take with or immediately following a meal., Disp: 180 tablet, Rfl: 3   nitroGLYCERIN  (NITROSTAT ) 0.4 MG SL tablet, Place 1 tablet (0.4 mg total) under the tongue every 5 (five) minutes as needed for chest pain., Disp: 25 tablet, Rfl: 0   rosuvastatin  (CRESTOR ) 40 MG tablet, TAKE 1 TABLET BY MOUTH EVERY DAY, Disp: 60 tablet, Rfl: 0   Objective:     Vitals:   05/11/24 1054  Pulse: 75  SpO2: 98%  Weight: 249 lb (112.9 kg)  Height: 6' (1.829 m)      Body mass index is 33.77 kg/m.    Physical Exam:    Gen: Appears well, nad, nontoxic and pleasant Psych: Alert and oriented, appropriate mood and affect Neuro: sensation intact, strength is 5/5 in upper and lower extremities, muscle tone wnl Skin: no susupicious lesions or rashes   Back - Normal skin, thoracic scoliosis.  No deformity. No tenderness to vertebral process palpation.   Right thoracic paraspinous muscles are   tender and without spasm  Electronically signed by:  Odis Mace [facility] 11:15 AM 05/11/24

## 2024-05-11 ENCOUNTER — Ambulatory Visit: Admitting: Sports Medicine

## 2024-05-11 VITALS — HR 75 | Ht 72.0 in | Wt 249.0 lb

## 2024-05-11 DIAGNOSIS — M546 Pain in thoracic spine: Secondary | ICD-10-CM

## 2024-05-11 DIAGNOSIS — M419 Scoliosis, unspecified: Secondary | ICD-10-CM | POA: Diagnosis not present

## 2024-05-11 DIAGNOSIS — G8929 Other chronic pain: Secondary | ICD-10-CM

## 2024-05-11 NOTE — Patient Instructions (Signed)
 Tylenol  [phone] mg 2-3 times a day for pain relief   NSAIDs as needed limit 1- 2 times per week  Continue HEP 1x a week   As needed follow up

## 2024-05-19 ENCOUNTER — Encounter (HOSPITAL_BASED_OUTPATIENT_CLINIC_OR_DEPARTMENT_OTHER): Payer: Self-pay

## 2024-05-23 ENCOUNTER — Ambulatory Visit: Attending: Cardiology | Admitting: Cardiology

## 2024-05-23 ENCOUNTER — Encounter: Payer: Self-pay | Admitting: Cardiology

## 2024-05-23 VITALS — BP 114/55 | HR 80 | Ht 72.0 in | Wt 254.7 lb

## 2024-05-23 DIAGNOSIS — I5032 Chronic diastolic (congestive) heart failure: Secondary | ICD-10-CM | POA: Diagnosis not present

## 2024-05-23 DIAGNOSIS — I341 Nonrheumatic mitral (valve) prolapse: Secondary | ICD-10-CM | POA: Diagnosis not present

## 2024-05-23 DIAGNOSIS — I493 Ventricular premature depolarization: Secondary | ICD-10-CM

## 2024-05-23 DIAGNOSIS — I4729 Other ventricular tachycardia: Secondary | ICD-10-CM | POA: Diagnosis not present

## 2024-05-23 DIAGNOSIS — I4719 Other supraventricular tachycardia: Secondary | ICD-10-CM

## 2024-05-23 DIAGNOSIS — M810 Age-related osteoporosis without current pathological fracture: Secondary | ICD-10-CM | POA: Diagnosis not present

## 2024-05-23 DIAGNOSIS — I2510 Atherosclerotic heart disease of native coronary artery without angina pectoris: Secondary | ICD-10-CM | POA: Diagnosis not present

## 2024-05-23 NOTE — Progress Notes (Unsigned)
 Electrophysiology Office Note:   Date:  05/24/2024  ID:  Carlos Suarez, DOB September 17, 1945, MRN [id number]  Primary Cardiologist: Jerel Balding, MD Electrophysiologist: Fonda Kitty, MD      History of Present Illness:   Carlos Suarez is a 78 y.o. male with h/o CAD s/p RCA PCI, HLD, OSA, VT previously on amiodarone  but stopped due to rash who is being seen today for abnormal Zio.  Discussed the use of AI scribe software for clinical note transcription with the patient, who gave verbal consent to proceed.  History of Present Illness Carlos Suarez is a 78 year old male with mitral valve prolapse who presents for follow-up regarding premature ventricular contractions (PVCs). He previously underwent an echocardiogram which showed a mitral valve prolapse and a normal heart pumping function with an ejection fraction of 50-55%. He is currently taking metoprolol  50 mg twice a day to manage PVCs. He experiences occasional lightheadedness and wooziness, which he feels may be related to changes in his 'ability to thermoregulate'. 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  He has been using a Kardia strip monitor to track his heart rhythm during episodes of dizziness, but the results have consistently shown no abnormalities such as tachycardia, bradycardia, or atrial fibrillation. He feels his heart in his ears at night, which he attributes to his hearing aids amplifying mechanical sounds.  He also has a history of trigeminal neuralgia, for which he is taking gabapentin .  No overt passing out episodes. No episodes of dizziness or lightheadedness that are different from his usual positional dizziness.   Review of systems complete and found to be negative unless listed in HPI.   EP Information / Studies Reviewed:    EKG is not ordered today. EKG from 04/18/24 reviewed which  showed SR with PVCs      Echo 05/2024:   1. Left ventricular ejection fraction, by estimation, is 50 to 55%. The  left ventricle has low normal function. Left ventricular endocardial  border not optimally defined to evaluate regional wall motion. There is  mild left ventricular hypertrophy. Left  ventricular diastolic parameters were grossly normal.   2. Right ventricular systolic function is mildly reduced. The right  ventricular size is normal. Tricuspid regurgitation signal is inadequate  for assessing PA pressure.   3. Mild bileaflet mitral valve prolapse. The mitral valve is myxomatous.  Mild mitral valve regurgitation. No evidence of mitral stenosis.   4. The aortic valve is tricuspid. Aortic valve regurgitation is mild. No  aortic stenosis is present.   LHC 08/2021:    Mid RCA lesion is 85% stenosed.   A drug-eluting stent was successfully placed using a STENT ONYX FRONTIER 4.0X18.   Post intervention, there is a 0% residual stenosis.   --------------------------------------------   Dist LAD lesion is 45% stenosed.   Ramus-1 lesion is 50% stenosed. Ramus-2 lesion is 50% stenosed.   --------------------------------------------   There is mild to moderate left ventricular systolic dysfunction.  The left ventricular ejection fraction is 35-45% by visual estimate.   LV end diastolic pressure is normal.   There is no aortic valve stenosis.   SUMMARY Diffuse coronary disease with severe single-vessel disease, mid RCA 85% stenosis, ramus intermedius tandem 50 to 60% stenoses Successful DES PCI of mid RCA focal 85% stenosis - reducing to 0%: Synergy DES 4.0 mm x 18 mm deployed to  4.1 mm TIMI-3 flow pre and post Mildly reduced LVEF of roughly 40 to 45%-inferior hypokinesis. Normal LVEDP  Physical Exam:   VS:  BP (!) 114/55 (BP Location: Left Arm, Patient Position: Sitting, Cuff Size: Large)   Pulse 80   Ht 6' (1.829 m)   Wt 254 lb 11.2 oz (115.5 kg)   SpO2 96%   BMI 34.54 kg/m     Wt Readings from Last 3 Encounters:  05/23/24 254 lb 11.2 oz (115.5 kg)  05/11/24 249 lb (112.9 kg)  04/25/24 246 lb (111.6 kg)     GEN: Well nourished, well developed in no acute distress NECK: No JVD CARDIAC: Normal rate, irregular RESPIRATORY:  Clear to auscultation without rales, wheezing or rhonchi  ABDOMEN: Soft, non-distended EXTREMITIES:  No edema; No deformity   ASSESSMENT AND PLAN:     #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  Occurring in setting of mitral valve prolapse.  Unclear how symptomatic he is from his PVCs.  Echocardiogram with preserved LVEF.  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  - If he develops symptoms or echocardiogram shows reduction in his LVEF, then antiarrhythmic drug therapy could be considered.  He is not candidate for 1C agent d/t CAD. Likely start mexiletine first, max out metoprolol , and repeat monitor to reassess for improvement. If mexiletine ineffective and EF is >40% then sotalol would be an appropriate option. He is willing to retry amiodarone  if necessary. Reported rash on one of his ankles after taking previously. 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  Well compensated today.  No clinical signs or symptoms of CHF.  -Follow up with Dr. Francyne for management.   #CAD: S/p PCI to RCA. No chest pain.  - Continue aspirin , ezetimibe  and rosuvastatin .  Follow up with Dr. Francyne. Can see Dr. Kennyth as needed.  Signed, Fonda Kennyth, MD

## 2024-05-23 NOTE — Patient Instructions (Signed)

## 2024-05-29 ENCOUNTER — Ambulatory Visit: Admitting: Cardiovascular Disease

## 2024-05-29 ENCOUNTER — Encounter: Payer: Self-pay | Admitting: Cardiovascular Disease

## 2024-05-29 ENCOUNTER — Ambulatory Visit: Attending: Cardiovascular Disease | Admitting: Cardiovascular Disease

## 2024-05-29 VITALS — BP 120/62 | HR 78 | Ht 72.0 in | Wt 247.0 lb

## 2024-05-29 DIAGNOSIS — E7800 Pure hypercholesterolemia, unspecified: Secondary | ICD-10-CM | POA: Diagnosis not present

## 2024-05-29 DIAGNOSIS — G4733 Obstructive sleep apnea (adult) (pediatric): Secondary | ICD-10-CM

## 2024-05-29 DIAGNOSIS — I341 Nonrheumatic mitral (valve) prolapse: Secondary | ICD-10-CM | POA: Diagnosis not present

## 2024-05-29 DIAGNOSIS — I472 Ventricular tachycardia, unspecified: Secondary | ICD-10-CM | POA: Diagnosis not present

## 2024-05-29 DIAGNOSIS — I2510 Atherosclerotic heart disease of native coronary artery without angina pectoris: Secondary | ICD-10-CM | POA: Diagnosis not present

## 2024-05-29 DIAGNOSIS — I454 Nonspecific intraventricular block: Secondary | ICD-10-CM

## 2024-05-29 DIAGNOSIS — I428 Other cardiomyopathies: Secondary | ICD-10-CM | POA: Diagnosis not present

## 2024-05-29 NOTE — Progress Notes (Signed)
 Cardiology Office Note:    Date:  05/29/2024   ID:  Carlos Suarez, DOB 24-Oct-1945, MRN [id number]  PCP:  Clarice Nottingham, MD   [facility] Providers Cardiologist:  Jerel Balding, MD Electrophysiologist:  Fonda Kitty, MD     Referring MD: Clarice Nottingham, MD   No chief complaint on file.   History of Present Illness:    Carlos Suarez is a 78 y.o. 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 Due to an abnormal stress test and dilated LV with decreased LVEF (45-50% on echo, 40% QGS), he underwent cardiac catheterization that showed a 85% mid RCA stenosis that was stented (Synergy DES 4.0x 18 Sep 10, 2021), also noted to have tandem 50-60% ramus intermedius artery stenoses. Follow up echo in May 2023 showed no improvement in LV function, but subsequent echo on 05/03/2024 showed near normal LVEF of 50-55%.  Arrhythmia monitoring showed a roughly 12% burden of PVCs including frequent triplets, once again asymptomatic.  His PVCs have sometimes led to artifactual reports of bradycardia.  Previous treatment with amiodarone  for his ventricular arrhythmia was stopped due to a pruritic erythematous rash (he had been started on 4 other medications around that same time: Jardiance , metoprolol , clopidogrel , ezetimibe ).  He was evaluated by EP (Dr. Sidra Kitty) and the decision was made to simply increase his beta-blocker for the time being, with options to try mexiletine, sotalol or even rechallenge with amiodarone  in the future if necessary.  He has not had dizziness or syncope.  He does complain of fatigue.  He is currently taking metoprolol  50 mg twice daily.  He has not had chest pain or shortness of breath.  Denies edema and claudication.  He is referred back by  Dr. Clarice after his outpatient 14-day arrhythmia monitor showed frequent PVCs and VT.  He had a high burden of PVCs representing almost 12 % of all recorded beats, including about 1000 ventricular triplets during the 2-week recording.  The longest episode of wide-complex tachycardia was only 7 beats in duration, but there did seem to be symptoms associated with some of the episodes of VT.  The episodes of PVCs and VT were clearly much more prevalent during daytime hours than during the night, suggesting a correlation with physical activity.  Also noted were a handful of brief episodes of ectopic atrial tachycardia, nonsustained, likely not clinically important.  There were no episodes of significant bradycardia, even though his smart watch has reported the heart rates in the 40s (I suspect this was pulse deficit due to irregular rhythm).  During stress testing and during the heart catheterization he had very frequent PVCs. Amiodarone  was initiated at the time of cardiac cath, when he was also started on clopidogrel  (for 6 months), Jardiance  and metoprolol . Zetia  was started relatively recently as well.  He developed a pruritic, slightly raised erythematous rash after starting amiodarone , ezetimibe  and Jardiance , that resolved after discontinuation of these medication.  Amiodarone  and Jardiance  have been discontinued.  He has continued on metoprolol  is now back on ezetimibe  and this has not caused any rashes.  On my review of his previous echocardiograms from December 2022 and May 2023 he has longstanding myxomatous mitral valve changes with late systolic prolapse of both the anterior and posterior leaflets.  There is late systolic mild-moderate mitral  insufficiency.  The mitral valve findings are essentially unchanged on the echocardiogram from September 2025.      Past Medical History:  Diagnosis Date   Family history of breast cancer in male    Family history of pancreatic cancer    Legally blind in  right eye, as defined in [place]      Past Surgical History:  Procedure Laterality Date   CORONARY STENT INTERVENTION N/A 09/10/2021   Procedure: CORONARY STENT INTERVENTION;  Surgeon: Anner Alm ORN, MD;  Location: [facility] INVASIVE CV LAB;  Service: Cardiovascular;  Laterality: N/A;   LEFT HEART CATH AND CORONARY ANGIOGRAPHY N/A 09/10/2021   Procedure: LEFT HEART CATH AND CORONARY ANGIOGRAPHY;  Surgeon: Anner Alm ORN, MD;  Location: [facility] INVASIVE CV LAB;  Service: Cardiovascular;  Laterality: N/A;    Current Medications: Current Meds  Medication Sig   aspirin  EC 81 MG tablet Take 81 mg by mouth daily. Swallow whole.   baclofen  (LIORESAL ) 10 MG tablet Take 1 tablet (10 mg total) by mouth 2 (two) times daily.   denosumab  (PROLIA ) 60 MG/ML SOSY injection 60 mg Subcutaneous every 6 months   ezetimibe  (ZETIA ) 10 MG tablet    gabapentin  (NEURONTIN ) 300 MG capsule Take 3 capsules (900 mg total) by mouth 3 (three) times daily. 2700 mg daily   metoprolol  succinate (TOPROL -XL) 50 MG 24 hr tablet Take 1 tablet (50 mg total) by mouth in the morning and at bedtime. Take with or immediately following a meal.   nitroGLYCERIN  (NITROSTAT ) 0.4 MG SL tablet Place 1 tablet (0.4 mg total) under the tongue every 5 (five) minutes as needed for chest pain.   rosuvastatin  (CRESTOR ) 40 MG tablet TAKE 1 TABLET BY MOUTH EVERY DAY     Allergies:   Fluothane [halothane], Oxycodone, and Oxycodone hcl   Family History: The patient's only history is significant for the absence of early onset CAD or CHF, known cardiomyopathy or unexplained premature death  ROS:   Please see the history of present illness.     All other systems reviewed and are negative.  EKGs/Labs/Other Studies Reviewed:   Cardiac catheterization   Mid RCA lesion is 85% stenosed.   A drug-eluting stent was successfully placed using a STENT ONYX FRONTIER 4.0X18.   Post intervention, there is a 0% residual stenosis.    --------------------------------------------   Dist LAD lesion is 45% stenosed.   Ramus-1 lesion is 50% stenosed. Ramus-2 lesion is 50% stenosed.   --------------------------------------------   There is mild to moderate left ventricular systolic dysfunction.  The left ventricular ejection fraction is 35-45% by visual estimate.   LV end diastolic pressure is normal.   There is no aortic valve stenosis.   SUMMARY Diffuse coronary disease with severe single-vessel disease, mid RCA 85% stenosis, ramus intermedius tandem 50 to 60% stenoses Successful DES PCI of mid RCA focal 85% stenosis - reducing to 0%: Synergy DES 4.0 mm x 18 mm deployed to 4.1 mm TIMI-3 flow pre and post Mildly reduced LVEF of roughly 40 to 45%-inferior hypokinesis. Normal LVEDP     RECOMMENDATIONS Monitor post PCI overnight.  Anticipate morning discharge Start low-dose Toprol , increase statin to 40 mg Based on this of significance of PVCs and reduced EF, will load with amiodarone  4 mg twice daily with plan for 1 week followed by 20 mg twice daily for 1 week and then 200 mg daily. Uninterrupted DAPT (CLOPIDOGREL  75 MG, ASPIRIN  81 MG) X 6 months min   The following studies were reviewed today: 08/06/2021 pharmacological  nuclear perfusion study   Findings are consistent with no prior ischemia and no prior myocardial infarction. The study is intermediate risk.   No ST deviation was noted.   LV perfusion is normal.   Left ventricular function is abnormal. Global function is moderately reduced. Nuclear stress EF: 40 %. The left ventricular ejection fraction is moderately decreased (30-44%). End diastolic cavity size is mildly enlarged.   Prior study not available for comparison.   Intermediate risk study with normal myocardial perfusion, but with a dilated left ventricle and moderately reduced global systolic function. Findings suggest nonischemic dilated cardiomyopathy, but gated images could be causing artifactual  underestimation of left ventricular function due to frequent PVCs. Recommend correlation with echocardiogram.  Echocardiogram 05/03/2024   1. Left ventricular ejection fraction, by estimation, is 50 to 55%. The  left ventricle has low normal function. Left ventricular endocardial  border not optimally defined to evaluate regional wall motion. There is  mild left ventricular hypertrophy. Left  ventricular diastolic parameters were grossly normal.   2. Right ventricular systolic function is mildly reduced. The right  ventricular size is normal. Tricuspid regurgitation signal is inadequate  for assessing PA pressure.   3. Mild bileaflet mitral valve prolapse. The mitral valve is myxomatous.  Mild mitral valve regurgitation. No evidence of mitral stenosis.   4. The aortic valve is tricuspid. Aortic valve regurgitation is mild. No  aortic stenosis is present.   EKG: Personally reviewed the most recent tracing from 04/18/2024 which shows Sinus rhythm with frequent PVCs, left axis deviation, nonspecific IVCD (QRS 128 ms)  EKG Interpretation Date/Time:    Ventricular Rate:    PR Interval:    QRS Duration:    QT Interval:    QTC Calculation:   R Axis:      Text Interpretation:           Recent Labs: 09/15/2023: ALT 8; B Natriuretic Peptide 78.1; BUN 16; Creatinine, Ser 1.16; Hemoglobin 14.9; Platelets 160; Potassium 3.7; Sodium 138  09/11/2021 Hemoglobin 12.4, creatinine 1.1, potassium 3.9, ALT 7.0, TSH 3.9 Recent Lipid Panel No results found for: CHOL, TRIG, HDL, CHOLHDL, VLDL, LDLCALC, LDLDIRECT 04/05/2023 Cholesterol 132, HDL 51, LDL 61, triglycerides 111  Risk Assessment/Calculations:            Physical Exam:    VS:  BP 120/62   Pulse 78   Ht 6' (1.829 m)   Wt 247 lb (112 kg)   SpO2 96%   BMI 33.50 kg/m     Wt Readings from Last 3 Encounters:  05/29/24 247 lb (112 kg)  05/23/24 254 lb 11.2 oz (115.5 kg)  05/11/24 249 lb (112.9 kg)       General: Alert, oriented x3, no distress, mildly obese, but also appears fit Head: Opacified right cornea, no exophtalmos or lid lag, no myxedema, no xanthelasma; normal ears, nose and oropharynx Neck: normal jugular venous pulsations and no hepatojugular reflux; brisk carotid pulses without delay and no carotid bruits Chest: clear to auscultation, no signs of consolidation by percussion or palpation, normal fremitus, symmetrical and full respiratory excursions Cardiovascular: normal position and quality of the apical impulse, regular rhythm with frequent ectopy, normal first and second heart sounds, apical late systolic murmur 1/6 no diastolic murmurs, rubs or gallops Abdomen: no tenderness or distention, no masses by palpation, no abnormal pulsatility or arterial bruits, normal bowel sounds, no hepatosplenomegaly Extremities: no clubbing, cyanosis or edema; 2+ radial, ulnar and brachial pulses bilaterally; 2+ right femoral, posterior tibial and dorsalis  pedis pulses; 2+ left femoral, posterior tibial and dorsalis pedis pulses; no subclavian or femoral bruits Neurological: grossly nonfocal Psych: Normal mood and affect     ASSESSMENT:    No diagnosis found.    PLAN:    In order of problems listed above:  PVCs/NSVT: At this point, it appears most likely that the PVCs are caused by his MVP and mildly reduced LVEF and are not actually the cause of the cardiomyopathy.  PVC morphology suggests left ventricular origin.  Amiodarone  was stopped after he developed a rash, although this could also have been secondary to the Jardiance .  Suggest to try to take the metoprolol  succinate as a single 100 mg dose at night to see if this helps complaints of fatigue. Cardiomyopathy: He does not have overt congestive heart failure.  No signs of hypervolemia. NYHA functional class I and is euvolemic without diuretics.   Mildly depressed LVEF confirmed by QGS, echo and LV angiography.  No improvement after  revascularization.  In part, reduced LVEF may be explained by dyssynchrony related to his IVCD.    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  Currently not on RAAS inhibitors.  Possibly had a rash from SGLT2 inhibitor.  Continue beta-blocker CAD: He does not have chest pain and he has never complained of angina pectoris.    We assumed that CAD was present based on the depressed LVEF and the pattern of regional inferior wall hypokinesis, but after placement of the right coronary artery stent there was no improvement in left ventricular systolic function.  Also did not appear to have a significant impact on his ventricular ectopy.  On aspirin , lipid-lowering therapy, beta-blocker. Cardiomyopathy appears out of proportion to the extent of the CAD.   IVCD: QRS duration 128 ms.  The abnormality is nonspecific and somewhat variable, sometimes looks more like a typical LAFB, at other times a broader atypical LBBB.  Unlikely to benefit from CRT unless becomes broader (and of course if he develops clinical HF). 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  May be a cause for his PVCs. HLP: Excellent LDL 55 (target less than 70).  Continue rosuvastatin . OSA: Trying to be compliant with his CPAP.  Not clear to what degree this is contributing to his fatigue versus the beta-blocker. Rash: Resolved after stopping Jardiance  and amiodarone .  Metoprolol  and clopidogrel  were continued and the rash did not recur.  The exact culprit was unclear.      Medication Adjustments/Labs and Tests Ordered: Current medicines are reviewed at length with the patient today.  Concerns regarding medicines are outlined above.  No orders of the defined types were placed in this encounter.  No orders of the defined types were placed in this encounter.   There are no Patient Instructions on file for this visit.   Signed, Jerel Balding, MD  05/29/2024  9:11 AM    [facility]

## 2024-05-29 NOTE — Patient Instructions (Signed)
 Medication Instructions:   Your physician recommends that you continue on your current medications as directed. Please refer to the Current Medication list given to you today.    *If you need a refill on your cardiac medications before your next appointment, please call your pharmacy*    Lab Work: NONE ORDERED  TODAY    If you have labs (blood work) drawn today and your tests are completely normal, you will receive your results only by: MyChart Message (if you have MyChart) OR A paper copy in the mail If you have any lab test that is abnormal or we need to change your treatment, we will call you to review the results.  Testing/Procedures: IN ONE YEAR  Your physician has requested that you have an echocardiogram. Echocardiography is a painless test that uses sound waves to create images of your heart. It provides your doctor with information about the size and shape of your heart and how well your heart's chambers and valves are working. This procedure takes approximately one hour. There are no restrictions for this procedure. Please do NOT wear cologne, perfume, aftershave, or lotions (deodorant is allowed). Please arrive 15 minutes prior to your appointment time.  Please note: We ask at that you not bring children with you during ultrasound (echo/ vascular) testing. Due to room size and safety concerns, children are not allowed in the ultrasound rooms during exams. Our front office staff cannot provide observation of children in our lobby area while testing is being conducted. An adult accompanying a patient to their appointment will only be allowed in the ultrasound room at the discretion of the ultrasound technician under special circumstances. We apologize for any inconvenience.       Follow-Up: At [facility], you and your health needs are our priority.  As part of our continuing mission to provide you with exceptional heart care, our providers are all part of one team.   This team includes your primary Cardiologist (physician) and Advanced Practice Providers or APPs (Physician Assistants and Nurse Practitioners) who all work together to provide you with the care you need, when you need it.  Your next appointment:  AFTER ECHO  IN  A YEAR    Provider:    Jerel Balding, MD     We recommend signing up for the patient portal called MyChart.  Sign up information is provided on this After Visit Summary.  MyChart is used to connect with patients for Virtual Visits (Telemedicine).  Patients are able to view lab/test results, encounter notes, upcoming appointments, etc.  Non-urgent messages can be sent to your provider as well.   To learn more about what you can do with MyChart, go to [email or web address].   Other Instructions

## 2024-05-30 ENCOUNTER — Encounter: Payer: Self-pay | Admitting: Physician Assistant

## 2024-05-30 ENCOUNTER — Ambulatory Visit: Admitting: Physician Assistant

## 2024-05-30 VITALS — BP 151/83 | HR 75 | Resp 20 | Ht 72.0 in | Wt 254.0 lb

## 2024-05-30 DIAGNOSIS — G4733 Obstructive sleep apnea (adult) (pediatric): Secondary | ICD-10-CM | POA: Diagnosis not present

## 2024-05-30 DIAGNOSIS — G500 Trigeminal neuralgia: Secondary | ICD-10-CM

## 2024-05-30 NOTE — Progress Notes (Signed)
 NEUROLOGY CONSULTATION NOTE  Carlos Suarez MRN: [id number] DOB: 05-Nov-1945   Referring provider: Clarice Nottingham, MD  Primary care provider: Clarice Nottingham, MD   Reason for consult:  facial and maxillary pain   Assessment/Plan:    Right facial pain, maxillary pain likely due to trigeminal neuralgia    Referral to [facility] Dr. Rosine, for consideration of Gamma Knife for possible trigeminal neuralgia as his symptoms are not responding to conservative management  In the interim, continue gabapentin  900 mg tid, side effects discussed  Continue baclofen  10 mg bid, side effects discussed   Subjective:     This is a delightful 78 yo man with 18 month history of unresolving right facial pain, following a V2 pattern, suspicious for trigeminal neuralgia.  Prior MRI of the brain was essentially unremarkable, with a dominant left vertebral artery as well as noted partial bilateral mastoid opacification.  MRA of the face to further evaluate the trigeminal nerve was negative for enhancement or masslike findings.  Of note, the right superior cerebellar artery trace versus next to the right trigeminal nerve was just beyond the root entry zone. There is no evidence of fracture or aggressive bone lesions.  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 Initially he was treated with gabapentin  100 mg 3 times daily, increasing the doses  to 900 mg tid.  He was on Tegretol  200 mg twice daily, without reaching a therapeutic response.  Eventually, Tegretol  was discontinued adding baclofen  5 mg twice daily, then prescribed at 10 mg twice daily for comfort.  For a few months he was able to perform activities of daily living without significant symptoms. Over the last 4 weeks a touch on face, washing the face, touching the eye triggers it . I can eat, drink, tap the teeth but pulling it hurts-he says. He uses CPAP daily for OSA, wonder if that can contribute to  the symptoms.   With the Fall allergens I notice a change in hearing too, and I have some lethargy-he reports.    In the past he was evaluated at [facility] 06/28/23, at the Trigeminal neuralgia clinic or other options, treatment, opinion: .    Trigeminal neuralgia: Patient is currently being treated by neurology at [facility] he has tried and failed Tegretol . Currently on a high dose of gabapentin  and baclofen  with suboptimal results. I believe that his cervical spine could possibly be contributing to his symptoms. He had a MRI of his trigeminal nerve a year ago that showed degenerative facet spurring on the right at C3-4 with advanced right foraminal stenosis. I have given the patient options of further exploring his neck with an MRI of the cervical spine or referral to [facility] to be evaluated for gamma knife. Patient reports that he is going to discuss this with his PCP and neurologist. Recommend he follow-up as needed. Patient would like to discuss with Dr. Rosine at the same facility, as he feels that all his questions were not answered to his satisfaction during the first meeting.    [facility] evaluation (second opinion) for Trigeminal Neuralgia 10/04/23 Dr. Gerome :  Carlos Suarez is a 78 year old male with past medical history including trigeminal neuralgia, dilated cardiomyopathy, CAD, HLD who is seen in the [facility] regarding trigeminal neuralgia. Patient with recurrent paroxysms of right facial pain in the trigeminal (V2) region that is brief, severe, tearing and sharp in quality, and occasionally precipitated  by light touch, chewing, or sleeping on right side of face consistent with a diagnosis of trigeminal neuralgia. Patient does have history of right orbital trauma that occurred decades ago including extruded lens, retinal detachment, and scleral buckle with at least two surgical procedures on right eye. While this may be contributing to his  symptoms, there is not a direct correlation between trigeminal symptom onset following orbital trauma. Patient has had multiple facial/brain MRI without evidence of acute findings, mass, or inflammatory process; imaging did show vascular loop passing adjacent to root entry on right fifth nerve that may be contributing to symptoms of trigeminal neuralgia. Patient reports adequate management on current medication regimen at this time.   Initial visit 02/06/2022 this is a very pleasant 78 year old man with a history of hypertension, hyperlipidemia, CAD status post stent placement, seen today for evaluation of R facial and maxillary pain. In review, in December, the patient was seen by a dentist due to tooth sensitivity, requiring dental work. At the time, he also reported sensitivity in the right eyebrow in a vertical line down to the R side of the nose and R nasolabial fold.   He tried topical therapy that did not help.  Dentist suggested that they might be sinuses due to light pressure on the right maxillary area causing tenderness. Denies numbness or tingling, being able to reproduce the pain, at one point, I felt the pain was coming from the neck. The R eyebrow pain is now coming and going, about a 2 or 3. Maxillary CT was negative for abnormal findings. He tried Flonase and chlorpheniramine for 6 weeks, only improving to 50 percent, but at times is severe. He denies any change in the sense of smell,  cold intolerance, head injury, pain with chewing. Initially the pain was extending to the right temple but this subsided. 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 No recent infection but his wife had shingles 1 months ago.  He was seen by ophthalmology as well, as 50 years ago he had eye trauma s/ scleral buckle becoming legally blind , but ophthalmic source was ruled out.        MRI of the brain 02/23/2022 negative for acute findings, partial  bilateral mastoid opacification, known bilateral cataract resection with probable right scleral buckle, patient is blind on the right eye.  Normal  marrow signal, dominant left vertebral artery, other major intracranial flow voids are preserved.  MRI of the face with trigeminal view with and without contrast 02/23/2022 negative for mass or inflammation, right superior cerebellar artery transverses in close proximity to the right trigeminal nerve near the root entry zone.  MRI face, trigeminal w and wo contrast 05/14/23  shows the vascular loop passing adjacent to the root entry zone of the right fifth nerve and possibly be an anterior inferior cerebellar artery loop or superior cerebellar artery loop. Similar appearance to the prior study. No evidence of neuroma.  PAST MEDICAL HISTORY: Past Medical History:  Diagnosis Date   Family history of breast cancer in male    Family history of pancreatic cancer    Legally blind in right eye, as defined in [place]      PAST SURGICAL HISTORY: Past Surgical History:  Procedure Laterality Date   CORONARY STENT INTERVENTION N/A 09/10/2021   Procedure: CORONARY STENT INTERVENTION;  Surgeon: Anner Alm ORN, MD;  Location: [facility] INVASIVE CV LAB;  Service: Cardiovascular;  Laterality: N/A;  LEFT HEART CATH AND CORONARY ANGIOGRAPHY N/A 09/10/2021   Procedure: LEFT HEART CATH AND CORONARY ANGIOGRAPHY;  Surgeon: Anner Alm ORN, MD;  Location: [facility] INVASIVE CV LAB;  Service: Cardiovascular;  Laterality: N/A;    MEDICATIONS: Current Outpatient Medications on File Prior to Visit  Medication Sig Dispense Refill   aspirin  EC 81 MG tablet Take 81 mg by mouth daily. Swallow whole.     baclofen  (LIORESAL ) 10 MG tablet Take 1 tablet (10 mg total) by mouth 2 (two) times daily. 60 each 11   denosumab  (PROLIA ) 60 MG/ML SOSY injection 60 mg Subcutaneous every 6 months 1 mL 1   ezetimibe  (ZETIA ) 10 MG tablet      gabapentin  (NEURONTIN ) 300 MG capsule Take 3 capsules (900 mg  total) by mouth 3 (three) times daily. 2700 mg daily 270 capsule 11   metoprolol  succinate (TOPROL -XL) 50 MG 24 hr tablet Take 1 tablet (50 mg total) by mouth in the morning and at bedtime. Take with or immediately following a meal. 180 tablet 3   nitroGLYCERIN  (NITROSTAT ) 0.4 MG SL tablet Place 1 tablet (0.4 mg total) under the tongue every 5 (five) minutes as needed for chest pain. 25 tablet 0   rosuvastatin  (CRESTOR ) 40 MG tablet TAKE 1 TABLET BY MOUTH EVERY DAY 60 tablet 0   triamcinolone lotion (KENALOG) 0.1 % Apply 1 Application topically as needed. (Patient not taking: Reported on 05/29/2024)     No current facility-administered medications on file prior to visit.    ALLERGIES: Allergies  Allergen Reactions   Fluothane [Halothane] Nausea And Vomiting   Oxycodone Other (See Comments)    confusion   Oxycodone Hcl Other (See Comments)    FAMILY HISTORY: Family History  Problem Relation Age of Onset   Breast cancer Brother 15   Pancreatic cancer Cousin        pat first cousin   Pancreatic cancer Cousin        pat first cousin      Objective:    General: NAD, well-groomed  Head: Normocephalic  Eyes: Right eye legally blind after blunt trauma to the eye  Neck: supple, no paraspinal tenderness, full range of motion Back: No paraspinal tenderness Neurological Exam: Mental status: Alert and oriented to person, place and time, recent and remote memory intact, follows knowledge intact, attention and concentration intact, speech fluent and not dysarthric, language intact.   Cranial nerves: CN I: not tested CN II: L pupils equal, round and reactive to light, visual fields intact.  Right lens opaque CN III, IV, VI: Full ROM, no nystagmus, no proptosis.  He has decreased vision on the right (legally blind). CN V: Facial sensation intact,  tenderness to palpation on the right eyebrow today,  R facial area,  discomfort with touching the right nasolabial area at the V2 region of the  nerve today CN VII: upper and lower face symmetric CN VIII: Hearing is now intact CN IX, X: Intact gag, uvula midline CN XI: Sternocleidomastoid and trapezius muscles intact CN XII: Tongue midline.  Area inside the mouth, shows  mucosal tenderness, especially at the frontal tooth and incisor area Bulk & Tone: Normal, no fasciculations  motor: Muscle strength 5 out of 5 throughout  sensation: Pinprick, temperature and vibratory sensation intact.  Gait: Normal station and stride.       Carlos Sevin, PA-C    Time spent: 35 minutes.

## 2024-05-30 NOTE — Patient Instructions (Signed)
 6 months

## 2024-05-31 ENCOUNTER — Telehealth: Payer: Self-pay

## 2024-05-31 ENCOUNTER — Other Ambulatory Visit: Payer: Self-pay

## 2024-05-31 DIAGNOSIS — G500 Trigeminal neuralgia: Secondary | ICD-10-CM

## 2024-05-31 NOTE — Telephone Encounter (Signed)
 Referral placed to [facility] [phone]. Faxed records information and demographics to [phone].

## 2024-06-05 DIAGNOSIS — G4733 Obstructive sleep apnea (adult) (pediatric): Secondary | ICD-10-CM | POA: Diagnosis not present

## 2024-06-09 ENCOUNTER — Other Ambulatory Visit: Payer: Self-pay | Admitting: Cardiovascular Disease

## 2024-07-05 DIAGNOSIS — E785 Hyperlipidemia, unspecified: Secondary | ICD-10-CM | POA: Diagnosis not present

## 2024-07-05 DIAGNOSIS — I471 Supraventricular tachycardia, unspecified: Secondary | ICD-10-CM | POA: Diagnosis not present

## 2024-07-05 DIAGNOSIS — L309 Dermatitis, unspecified: Secondary | ICD-10-CM | POA: Diagnosis not present

## 2024-07-05 DIAGNOSIS — I10 Essential (primary) hypertension: Secondary | ICD-10-CM | POA: Diagnosis not present

## 2024-07-05 DIAGNOSIS — E669 Obesity, unspecified: Secondary | ICD-10-CM | POA: Diagnosis not present

## 2024-07-05 DIAGNOSIS — M810 Age-related osteoporosis without current pathological fracture: Secondary | ICD-10-CM | POA: Diagnosis not present

## 2024-07-05 DIAGNOSIS — I700 Atherosclerosis of aorta: Secondary | ICD-10-CM | POA: Diagnosis not present

## 2024-07-05 DIAGNOSIS — G40909 Epilepsy, unspecified, not intractable, without status epilepticus: Secondary | ICD-10-CM | POA: Diagnosis not present

## 2024-07-05 DIAGNOSIS — I472 Ventricular tachycardia, unspecified: Secondary | ICD-10-CM | POA: Diagnosis not present

## 2024-08-02 ENCOUNTER — Ambulatory Visit: Admitting: Nurse Practitioner

## 2024-08-02 ENCOUNTER — Encounter: Payer: Self-pay | Admitting: Nurse Practitioner

## 2024-08-02 VITALS — BP 118/62 | HR 82 | Temp 98.2°F | Ht 72.0 in | Wt 251.4 lb

## 2024-08-02 DIAGNOSIS — E669 Obesity, unspecified: Secondary | ICD-10-CM

## 2024-08-02 DIAGNOSIS — G4739 Other sleep apnea: Secondary | ICD-10-CM | POA: Diagnosis not present

## 2024-08-02 NOTE — Assessment & Plan Note (Signed)
 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 Understands risks of untreated severe sleep apnea and potential treatment options. Completed in lab titration study and was well controlled on CPAP 14 cmH2O without central emergences. Tolerated well with EPR at level 3.  Previously adjusted him to a set pressure of 14 cmH2O which was uncomfortable so changed him back to an auto setting of 10-14 cmH2O. Unfortunately, still having leaks which are bothersome and some residual events. During his titration, he was well controlled on AirFit F20. Will retrial him on this at home. Advised on measures to limit skin irritation. Advised to monitor and notify if he's still having difficulties. Also recommended alternative pillow options. Receives benefit. Aware of proper care/use. Excellent compliance so far. Safe driving practices reviewed.   Patient Instructions  Continue to use CPAP every night, minimum of 4-6 hours a night.  Change equipment as directed. Wash your tubing with warm soap and water daily, hang to dry. Wash humidifier portion weekly. Use bottled, distilled water and change daily Be aware of reduced alertness and do not drive or operate heavy machinery if experiencing this or drowsiness.  Exercise encouraged, as tolerated. Healthy weight management discussed.  Avoid or decrease alcohol consumption and medications that make you more sleepy, if possible. Notify if persistent daytime sleepiness occurs even with consistent use of PAP therapy.  Change CPAP supplies... Every month Mask cushions and/or nasal pillows CPAP machine filters Every 3 months Mask frame (not including the headgear) CPAP tubing Every 6 months Mask headgear Chin strap (if applicable) Humidifier water tub   Try switching back to the AirFit F20 mask  Use a mole skin or mepilex dressing to help protect the bridge of your nose. You can also get a mask liner off [employer] Try looking at  different cervical spine pillows for night use   Let me know if you're still having large leaks with the mask change  Follow up in 6 months with Dr. Neda, Dr. Theodoro, or Dr. Olena to establish care (30 min slot). If symptoms do not improve or worsen, please contact office for sooner follow up

## 2024-08-02 NOTE — Assessment & Plan Note (Signed)
 Reviewed correlation between obesity and sleep apnea.  Healthy weight loss encouraged.

## 2024-08-02 NOTE — Progress Notes (Addendum)
 @Patient  ID: Carlos Suarez, male    DOB: [birth year], 78 y.o.   MRN: [id number]  Chief Complaint  Patient presents with   Obstructive Sleep Apnea    Wearing CPAP-pr. Is good, little leak around mask    Referring provider: Clarice Nottingham, MD  HPI: 78 year old male, never smoker followed for sleep apnea.  Past medical history significant for CAD, cardiomyopathy, frequent PVCs, trigeminal neuralgia, HLD.  TEST/EVENTS:  06/12/2023 HST: AHI 55.3/h, SpO2 low 59% 10/29/2023 CPAP titration >> optimal pressure 14 cmH2O with airfit F20 full face mask,  EPR at level 3  06/07/2023:OV with Carlos Gruenhagen NP for sleep consult, referred by Dr. Clarice.  His wife is with him today who helps provide some of his history.  She has sleep apnea and is concerned that he also does. Loud snoring, witnessed apneas by his wife. He has had issues with reflux in the past. Has woken up gasping for air when these were happening but has not occurred more recently. Dry mouth during the night.  He is not sure if he is more tired during the day or if it is the current medications that he is on. He's recently been started on gabapentin  and baclofen  for trigeminal neuralgia.  Feels okay when he wakes up in the morning.  Does have some restlessness when he sleeps at night.  Denies any drowsy driving, morning headaches, sleep parasomnia/paralysis.  No history of narcolepsy or symptoms of cataplexy. He goes to bed between 10 PM and midnight.  Usually falls asleep relatively quickly.  They have an older dog who tends to wake him up at night; otherwise, he is usually up 1-2 times a night to use the restroom.  Gets up around 7 to 9 AM in the morning.  He is retired.  Weight is down 5 to 8 pounds over the last 2 years.  Never had a previous sleep study.  Does not currently wear CPAP or oxygen. He has a history of hypertension, PVCs and cardiomyopathy.  No history of stroke or diabetes.  He follows with cardiology.  He had a stent placed in January  2023. He is a never smoker.  Does not drink alcohol.  No excessive caffeine intake.  No sleep aids. Lives with his wife of 54 years.  Has adult children.  Family history of brother with asthma. 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  His central events made up 32% of his overall apneic events.  He feels unchanged compared to his last visit.  He did have some issues the third night of his study where the cannula and finger probe both came off.  He was not sure if this affected the results of his study.  He does not have any issues with drowsy driving, morning headaches or sleep parasomnia/paralysis.  Wants to discuss treatment options today.  11/17/2023: OV with Carlos Zentz NP for follow up after undergoing CPAP titration. He was optimally controlled on setting of 14 cmH2O with an EPR of level 3. He was tested on a F20 full face mask, which is what he is currently wearing at home; although, he has to switch back and forth between this and the [facility] due to the pressure on the bridge of his nose. He also likes to put his CPAP on and read some before he falls asleep, which he can't do with the  full face over the bridge of his nose. He finds the harder plastic on the [place] presses into his nose, which can be uncomfortable.  Otherwise, he is wearing his CPAP nightly. He does feel like it is helping with his sleep. Feels more restful. Not snoring with the CPAP. Denies drowsy driving or sleep parasomnias.  10/18/2023-11/16/2023 CPAP 5-20 cmH2O 28/30 days; 93% >4 hr; average use 8 hr 4 min Pressure 95th 12.1 Leaks 95th 43.8 AHI 9.6  01/14/2024: OV with Carlos Rau NP for follow-up.  At his last visit, we adjusted his CPAP pressures to a set pressure of 14 cmH2O.  He is still alternating back-and-forth between the [facility] fullface mask and the AirFit F20 in size large.  The AirFit F20 is more  comfortable but has a lot of leaks and he cannot wear his glasses with it.  The [place] has a plastic piece that tends to present to his face and he finds it uncomfortable.  It does tend to fit better and not leak as much.  When he has a night with a good seal, he does feel like he sleeps better and sleep is more restful.  The nights that he is having to fight with his mask fit and it is leaking more, he does not really notice a difference.  Not snoring with CPAP.  Denies drowsy driving or sleep parasomnias. 12/14/2023-01/12/2024 CPAP 14 cmH2O 30/30 days; 100% >4 hr; average use 7 hr 48 m Leaks 95th 59.4 AHI 5.5   02/11/2024: OV with Carlos Trieu NP History of Present Illness   Carlos Suarez is a 78 year old male with sleep apnea who presents for follow-up regarding CPAP therapy. New CPAP mask fits comfortably. Still having issues with leaks throughout the night.  His sleep is more restful, but he experiences interruptions when the machine increases pressure, requiring him to turn it off and on again to relieve the high pressure setting. The pressure starts at four, which is comfortable, but increases to fourteen, causing discomfort and making him feel 'inflated'. Energy levels are better during the day. No issues with drowsy driving. Not snoring with CPAP. 01/11/2024-02/09/2024 CPAP 14 cmH2O 30/30 days; 100% >4 hr; average use 7 hr 49 min Leaks 95th 75.1 AHI 5.8  08/02/2024: Today - follow up Discussed the use of AI scribe software for clinical note transcription with the patient, who gave verbal consent to proceed.  History of Present Illness Carlos Suarez is a 78 year old male with sleep apnea who presents for follow up.  He has ongoing issues with his CPAP mask, specifically experiencing leaks. The mask sometimes gets dislodged during the night, and he recently noticed a clip came off without his awareness. He is currently using the AirFit F40 mask, which he finds more comfortable than previous  masks tried, including the [place] and AirFit F20, but still having large leaks, which are sometimes bothersome to him. Pressures do feel better from the set pressure of 14 cmH2O.  His sleep is more comfortable when lying flat on the mattress without a pillow, as opposed to using a pillow which sometimes causes his neck to flex and he feels this impacts his breathing at night. He has been experimenting with different pillow arrangements to improve comfort and reduce neck strain.  He has a history of trigeminal neuralgia, which affects his ability to tolerate pressure on certain areas of his face, which sometimes causes difficulties with the mask.   He does feel good on CPAP for the most part.  He feels energy levels are better with CPAP. Denies drowsy driving.     Allergies  Allergen Reactions   Fluothane [Halothane] Nausea And Vomiting   Oxycodone Other (See Comments)    confusion   Oxycodone Hcl Other (See Comments)     There is no immunization history on file for this patient.  Past Medical History:  Diagnosis Date   Family history of breast cancer in male    Family history of pancreatic cancer    Legally blind in right eye, as defined in [place]      Tobacco History: Social History   Tobacco Use  Smoking Status Never  Smokeless Tobacco Never   Counseling given: Not Answered   Outpatient Medications Prior to Visit  Medication Sig Dispense Refill   aspirin  EC 81 MG tablet Take 81 mg by mouth daily. Swallow whole.     baclofen  (LIORESAL ) 10 MG tablet Take 1 tablet (10 mg total) by mouth 2 (two) times daily. 60 each 11   denosumab  (PROLIA ) 60 MG/ML SOSY injection 60 mg Subcutaneous every 6 months 1 mL 1   ezetimibe  (ZETIA ) 10 MG tablet      gabapentin  (NEURONTIN ) 300 MG capsule Take 3 capsules (900 mg total) by mouth 3 (three) times daily. 2700 mg daily 270 capsule 11   metoprolol  succinate (TOPROL -XL) 50 MG 24 hr tablet Take 1 tablet (50 mg total) by mouth in the morning and at  bedtime. Take with or immediately following a meal. 180 tablet 3   nitroGLYCERIN  (NITROSTAT ) 0.4 MG SL tablet Place 1 tablet (0.4 mg total) under the tongue every 5 (five) minutes as needed for chest pain. 25 tablet 0   rosuvastatin  (CRESTOR ) 40 MG tablet TAKE 1 TABLET BY MOUTH EVERY DAY 90 tablet 3   triamcinolone lotion (KENALOG) 0.1 % Apply 1 Application topically as needed.     No facility-administered medications prior to visit.     Review of Systems: as above    Physical Exam:  BP 118/62   Pulse 82   Temp 98.2 F (36.8 C) (Oral)   Ht 6' (1.829 m)   Wt 251 lb 6.4 oz (114 kg)   SpO2 97% Comment: RA  BMI 34.10 kg/m   GEN: Pleasant, interactive, well-appearing; obese; in no acute distress HEENT:  Normocephalic and atraumatic. PERRLA. Sclera white. Nasal turbinates pink, moist and patent bilaterally. No rhinorrhea present. Oropharynx pink and moist, without exudate or edema. No lesions, ulcerations, or postnasal drip. Mallampati II NECK:  Supple w/ fair ROM.  CV: RRR, no m/r/g PULMONARY:  Unlabored, regular breathing. Clear bilaterally A&P w/o wheezes/rales/rhonchi. No accessory muscle use.  GI: BS present and normoactive. Soft, non-tender to palpation. MSK: No erythema, warmth or tenderness.  Neuro: A/Ox3. No focal deficits noted.   Skin: Warm, no lesions or rashe Psych: Normal affect and behavior. Judgement and thought content appropriate.     Lab Results:  CBC    Component Value Date/Time   WBC 7.4 09/15/2023 1250   RBC 4.88 09/15/2023 1250   HGB 14.9 09/15/2023 1250   HGB 13.8 09/02/2021 1233   HCT 44.0 09/15/2023 1250   HCT 42.7 09/02/2021 1233   PLT 160 09/15/2023 1250   PLT 161 09/02/2021 1233   MCV 90.2 09/15/2023 1250   MCV 93 09/02/2021 1233   MCH 30.5 09/15/2023 1250   MCHC 33.9 09/15/2023 1250   RDW 12.6 09/15/2023 1250   RDW 12.2 09/02/2021 1233    BMET    Component Value Date/Time  NA 138 09/15/2023 1550   NA 142 09/02/2021 1233   K 3.7  09/15/2023 1550   CL 104 09/15/2023 1550   CO2 24 09/15/2023 1550   GLUCOSE 91 09/15/2023 1550   BUN 16 09/15/2023 1550   BUN 17 09/02/2021 1233   CREATININE 1.16 09/15/2023 1550   CALCIUM  9.3 09/15/2023 1550   GFRNONAA >60 09/15/2023 1550    BNP    Component Value Date/Time   BNP 78.1 09/15/2023 1250     Imaging:  No results found.   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 Understands risks of untreated severe sleep apnea and potential treatment options. Completed in lab titration study and was well controlled on CPAP 14 cmH2O without central emergences. Tolerated well with EPR at level 3.  Previously adjusted him to a set pressure of 14 cmH2O which was uncomfortable so changed him back to an auto setting of 10-14 cmH2O. Unfortunately, still having leaks which are bothersome and some residual events. During his titration, he was well controlled on AirFit F20. Will retrial him on this at home. Advised on measures to limit skin irritation. Advised to monitor and notify if he's still having difficulties. Also recommended alternative pillow options. Receives benefit. Aware of proper care/use. Excellent compliance so far. Safe driving practices reviewed.   Patient Instructions  Continue to use CPAP every night, minimum of 4-6 hours a night.  Change equipment as directed. Wash your tubing with warm soap and water daily, hang to dry. Wash humidifier portion weekly. Use bottled, distilled water and change daily Be aware of reduced alertness and do not drive or operate heavy machinery if experiencing this or drowsiness.  Exercise encouraged, as tolerated. Healthy weight management discussed.  Avoid or decrease alcohol consumption and medications that make you more sleepy, if  possible. Notify if persistent daytime sleepiness occurs even with consistent use of PAP therapy.  Change CPAP supplies... Every month Mask cushions and/or nasal pillows CPAP machine filters Every 3 months Mask frame (not including the headgear) CPAP tubing Every 6 months Mask headgear Chin strap (if applicable) Humidifier water tub   Try switching back to the AirFit F20 mask  Use a mole skin or mepilex dressing to help protect the bridge of your nose. You can also get a mask liner off [employer] Try looking at different cervical spine pillows for night use   Let me know if you're still having large leaks with the mask change  Follow up in 6 months with Dr. Neda, Dr. Theodoro, or Dr. Olena to establish care (30 min slot). If symptoms do not improve or worsen, please contact office for sooner follow up    Obesity (BMI 30-39.9) Reviewed correlation between obesity and sleep apnea. Healthy weight loss encouraged.    I spent 35 minutes of dedicated to the care of this patient on the date of this encounter to include pre-visit review of records, face-to-face time with the patient discussing conditions above, post visit ordering of testing, clinical documentation with the electronic health record, making appropriate referrals as documented, and communicating necessary findings to members of the patients care team.  Comer LULLA Rouleau, NP 08/02/2024  Pt aware and understands NP's role.

## 2024-08-02 NOTE — Patient Instructions (Signed)
 Continue to use CPAP every night, minimum of 4-6 hours a night.  Change equipment as directed. Wash your tubing with warm soap and water daily, hang to dry. Wash humidifier portion weekly. Use bottled, distilled water and change daily Be aware of reduced alertness and do not drive or operate heavy machinery if experiencing this or drowsiness.  Exercise encouraged, as tolerated. Healthy weight management discussed.  Avoid or decrease alcohol consumption and medications that make you more sleepy, if possible. Notify if persistent daytime sleepiness occurs even with consistent use of PAP therapy.  Change CPAP supplies... Every month Mask cushions and/or nasal pillows CPAP machine filters Every 3 months Mask frame (not including the headgear) CPAP tubing Every 6 months Mask headgear Chin strap (if applicable) Humidifier water tub   Try switching back to the AirFit F20 mask  Use a mole skin or mepilex dressing to help protect the bridge of your nose. You can also get a mask liner off [employer] Try looking at different cervical spine pillows for night use   Let me know if you're still having large leaks with the mask change  Follow up in 6 months with Dr. Neda, Dr. Theodoro, or Dr. Olena to establish care (30 min slot). If symptoms do not improve or worsen, please contact office for sooner follow up

## 2024-08-15 ENCOUNTER — Other Ambulatory Visit: Payer: Self-pay | Admitting: Physician Assistant

## 2024-12-06 ENCOUNTER — Ambulatory Visit: Admitting: Physician Assistant

## 2025-05-09 ENCOUNTER — Other Ambulatory Visit (HOSPITAL_COMMUNITY)
